# Patient Record
Sex: Male | Born: 1960 | Race: White | Hispanic: No | Marital: Married | Smoking: Current every day smoker
Health system: Southern US, Community
[De-identification: ages and names within clinical notes are randomized; demographics above are authoritative.]

## PROBLEM LIST (undated history)

## (undated) DIAGNOSIS — I499 Cardiac arrhythmia, unspecified: Secondary | ICD-10-CM

## (undated) DIAGNOSIS — M1990 Unspecified osteoarthritis, unspecified site: Secondary | ICD-10-CM

## (undated) DIAGNOSIS — M542 Cervicalgia: Secondary | ICD-10-CM

## (undated) DIAGNOSIS — I219 Acute myocardial infarction, unspecified: Secondary | ICD-10-CM

## (undated) DIAGNOSIS — E119 Type 2 diabetes mellitus without complications: Secondary | ICD-10-CM

## (undated) DIAGNOSIS — E785 Hyperlipidemia, unspecified: Secondary | ICD-10-CM

## (undated) DIAGNOSIS — J449 Chronic obstructive pulmonary disease, unspecified: Secondary | ICD-10-CM

## (undated) DIAGNOSIS — K219 Gastro-esophageal reflux disease without esophagitis: Secondary | ICD-10-CM

## (undated) DIAGNOSIS — K5792 Diverticulitis of intestine, part unspecified, without perforation or abscess without bleeding: Secondary | ICD-10-CM

## (undated) DIAGNOSIS — R0602 Shortness of breath: Secondary | ICD-10-CM

## (undated) DIAGNOSIS — J189 Pneumonia, unspecified organism: Secondary | ICD-10-CM

## (undated) DIAGNOSIS — I10 Essential (primary) hypertension: Secondary | ICD-10-CM

## (undated) DIAGNOSIS — I639 Cerebral infarction, unspecified: Secondary | ICD-10-CM

## (undated) DIAGNOSIS — G709 Myoneural disorder, unspecified: Secondary | ICD-10-CM

## (undated) DIAGNOSIS — G4730 Sleep apnea, unspecified: Secondary | ICD-10-CM

## (undated) HISTORY — DX: Hyperlipidemia, unspecified: E78.5

## (undated) HISTORY — DX: Acute myocardial infarction, unspecified: I21.9

## (undated) HISTORY — DX: Type 2 diabetes mellitus without complications: E11.9

## (undated) HISTORY — PX: OTHER SURGICAL HISTORY: SHX169

## (undated) HISTORY — DX: Myoneural disorder, unspecified: G70.9

## (undated) HISTORY — DX: Essential (primary) hypertension: I10

## (undated) HISTORY — DX: Cervicalgia: M54.2

---

## 2007-08-27 HISTORY — PX: FRACTURE SURGERY: SHX138

## 2007-08-27 HISTORY — PX: EYE SURGERY: SHX253

## 2010-08-26 DIAGNOSIS — J449 Chronic obstructive pulmonary disease, unspecified: Secondary | ICD-10-CM

## 2010-08-26 HISTORY — DX: Chronic obstructive pulmonary disease, unspecified: J44.9

## 2011-08-27 DIAGNOSIS — E785 Hyperlipidemia, unspecified: Secondary | ICD-10-CM

## 2011-08-27 HISTORY — DX: Hyperlipidemia, unspecified: E78.5

## 2011-08-27 HISTORY — PX: CARDIAC CATHETERIZATION: SHX172

## 2012-08-26 DIAGNOSIS — E119 Type 2 diabetes mellitus without complications: Secondary | ICD-10-CM

## 2012-08-26 DIAGNOSIS — I10 Essential (primary) hypertension: Secondary | ICD-10-CM

## 2012-08-26 HISTORY — DX: Type 2 diabetes mellitus without complications: E11.9

## 2012-08-26 HISTORY — DX: Essential (primary) hypertension: I10

## 2012-08-26 HISTORY — PX: COLONOSCOPY: SHX174

## 2013-01-30 DIAGNOSIS — R079 Chest pain, unspecified: Secondary | ICD-10-CM

## 2013-08-26 HISTORY — PX: SPINE SURGERY: SHX786

## 2013-11-10 ENCOUNTER — Other Ambulatory Visit: Payer: Self-pay | Admitting: Orthopedic Surgery

## 2013-11-10 DIAGNOSIS — M545 Low back pain, unspecified: Secondary | ICD-10-CM

## 2013-11-10 DIAGNOSIS — M542 Cervicalgia: Secondary | ICD-10-CM

## 2013-11-18 ENCOUNTER — Ambulatory Visit
Admission: RE | Admit: 2013-11-18 | Discharge: 2013-11-18 | Disposition: A | Discharge status: Home / Self Care | Payer: BC Managed Care – PPO | Source: Ambulatory Visit | Attending: Orthopedic Surgery | Admitting: Orthopedic Surgery

## 2013-11-18 DIAGNOSIS — M545 Low back pain, unspecified: Secondary | ICD-10-CM

## 2013-11-18 DIAGNOSIS — M542 Cervicalgia: Secondary | ICD-10-CM

## 2013-12-08 ENCOUNTER — Encounter (HOSPITAL_COMMUNITY): Payer: Self-pay | Admitting: Pharmacy Technician

## 2013-12-08 ENCOUNTER — Other Ambulatory Visit: Payer: Self-pay | Admitting: Orthopedic Surgery

## 2013-12-14 NOTE — Progress Notes (Signed)
Anesthesia Chart Review:  Patient is a 53 year old male scheduled for C6-7 ACDF on 12/16/13.  Her PAT appointment is scheduled for 12/15/13 at 2 PM.  I did receive a note of cardiac clearance from Dr. Adele Barthel. Matthew Stewart with [facility].  According to 12/01/13 office note, he had a PCI in 2012 in [place] and an echocardiogram in 05/2013 showed: EF 58-09%, grade 1 diastolic dysfunction.  I already requested records (ie, cath, stress, echo, and EKG) from [facility], but only an EKG from 12/01/13 was received that showed NSR.  Additional history and pre-operative testing pending his PAT visit tomorrow.  If additional outside records needed (ie 2012 cath) then it will have to be requested once he signs a medical release.  Matthew Stewart [facility] Short Stay Center/Anesthesiology Phone [phone] 12/14/2013 7:10 PM

## 2013-12-15 ENCOUNTER — Other Ambulatory Visit (HOSPITAL_COMMUNITY): Payer: Self-pay | Admitting: *Deleted

## 2013-12-15 ENCOUNTER — Encounter (HOSPITAL_COMMUNITY): Payer: Self-pay

## 2013-12-15 ENCOUNTER — Encounter (HOSPITAL_COMMUNITY)
Admission: RE | Admit: 2013-12-15 | Discharge: 2013-12-15 | Disposition: A | Discharge status: Home / Self Care | Payer: BC Managed Care – PPO | Source: Ambulatory Visit | Attending: Orthopedic Surgery | Admitting: Orthopedic Surgery

## 2013-12-15 HISTORY — DX: Gastro-esophageal reflux disease without esophagitis: K21.9

## 2013-12-15 HISTORY — DX: Acute myocardial infarction, unspecified: I21.9

## 2013-12-15 HISTORY — DX: Diverticulitis of intestine, part unspecified, without perforation or abscess without bleeding: K57.92

## 2013-12-15 HISTORY — DX: Pneumonia, unspecified organism: J18.9

## 2013-12-15 HISTORY — DX: Chronic obstructive pulmonary disease, unspecified: J44.9

## 2013-12-15 HISTORY — DX: Shortness of breath: R06.02

## 2013-12-15 HISTORY — DX: Unspecified osteoarthritis, unspecified site: M19.90

## 2013-12-15 HISTORY — DX: Cardiac arrhythmia, unspecified: I49.9

## 2013-12-15 LAB — CBC WITH DIFFERENTIAL/PLATELET
Basophils Absolute: 0.1 10*3/uL (ref 0.0–0.1)
Basophils Relative: 1 % (ref 0–1)
Eosinophils Absolute: 0.7 10*3/uL (ref 0.0–0.7)
Eosinophils Relative: 8 % — ABNORMAL HIGH (ref 0–5)
HCT: 51 % (ref 39.0–52.0)
HEMOGLOBIN: 17.4 g/dL — AB (ref 13.0–17.0)
LYMPHS PCT: 40 % (ref 12–46)
Lymphs Abs: 3.2 10*3/uL (ref 0.7–4.0)
MCH: 31.8 pg (ref 26.0–34.0)
MCHC: 34.1 g/dL (ref 30.0–36.0)
MCV: 93.2 fL (ref 78.0–100.0)
Monocytes Absolute: 0.6 10*3/uL (ref 0.1–1.0)
Monocytes Relative: 8 % (ref 3–12)
NEUTROS PCT: 43 % (ref 43–77)
Neutro Abs: 3.4 10*3/uL (ref 1.7–7.7)
PLATELETS: 193 10*3/uL (ref 150–400)
RBC: 5.47 MIL/uL (ref 4.22–5.81)
RDW: 12.9 % (ref 11.5–15.5)
WBC: 7.9 10*3/uL (ref 4.0–10.5)

## 2013-12-15 LAB — COMPREHENSIVE METABOLIC PANEL
ALK PHOS: 94 U/L (ref 39–117)
ALT: 54 U/L — AB (ref 0–53)
AST: 37 U/L (ref 0–37)
Albumin: 4 g/dL (ref 3.5–5.2)
BILIRUBIN TOTAL: 0.3 mg/dL (ref 0.3–1.2)
BUN: 19 mg/dL (ref 6–23)
CO2: 22 mEq/L (ref 19–32)
Calcium: 9.8 mg/dL (ref 8.4–10.5)
Chloride: 102 mEq/L (ref 96–112)
Creatinine, Ser: 0.84 mg/dL (ref 0.50–1.35)
GFR calc non Af Amer: >90 mL/min (ref >90)
Glucose, Bld: 196 mg/dL — ABNORMAL HIGH (ref 70–99)
POTASSIUM: 4.8 meq/L (ref 3.7–5.3)
SODIUM: 139 meq/L (ref 137–147)
Total Protein: 7.6 g/dL (ref 6.0–8.3)

## 2013-12-15 LAB — URINE MICROSCOPIC-ADD ON

## 2013-12-15 LAB — SURGICAL PCR SCREEN
MRSA, PCR: NEGATIVE
Staphylococcus aureus: NEGATIVE

## 2013-12-15 LAB — URINALYSIS, ROUTINE W REFLEX MICROSCOPIC
Bilirubin Urine: NEGATIVE
Glucose, UA: >1000 mg/dL — AB
KETONES UR: NEGATIVE mg/dL
Leukocytes, UA: NEGATIVE
Nitrite: NEGATIVE
PH: 5 (ref 5.0–8.0)
PROTEIN: NEGATIVE mg/dL
Specific Gravity, Urine: 1.041 — ABNORMAL HIGH (ref 1.005–1.030)
Urobilinogen, UA: 0.2 mg/dL (ref 0.0–1.0)

## 2013-12-15 LAB — PROTIME-INR
INR: 0.95 (ref 0.00–1.49)
Prothrombin Time: 12.5 seconds (ref 11.6–15.2)

## 2013-12-15 LAB — TYPE AND SCREEN
ABO/RH(D): B POS
Antibody Screen: NEGATIVE

## 2013-12-15 LAB — APTT: aPTT: 27 seconds (ref 24–37)

## 2013-12-15 LAB — ABO/RH: ABO/RH(D): B POS

## 2013-12-15 MED ORDER — POVIDONE-IODINE 7.5 % EX SOLN
Freq: Once | CUTANEOUS | Status: DC
  Filled 2013-12-15: qty 118

## 2013-12-15 MED ORDER — CEFAZOLIN SODIUM-DEXTROSE 2-3 GM-% IV SOLR
2.0000 g | INTRAVENOUS | Status: AC
  Administered 2013-12-16: 2 g via INTRAVENOUS
  Filled 2013-12-15: qty 50

## 2013-12-15 NOTE — Progress Notes (Signed)
12/15/13 1442  OBSTRUCTIVE SLEEP APNEA  Have you ever been diagnosed with sleep apnea through a sleep study? No  Do you snore loudly (loud enough to be heard through closed doors)?  0  Do you often feel tired, fatigued, or sleepy during the daytime? 1  Has anyone observed you stop breathing during your sleep? 0  Do you have, or are you being treated for high blood pressure? 0  BMI more than 35 kg/m2? 1  Age over 52 years old? 1  Neck circumference greater than 40 cm/16 inches? 1  Gender: 1  Obstructive Sleep Apnea Score 5  Score 4 or greater  Results sent to PCP

## 2013-12-15 NOTE — H&P (Addendum)
PREOPERATIVE H&P  Chief Complaint: left arm pain   HPI: Matthew Stewart is a 53 y.o. male who presents with ongoing pain in the left arm. 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 Patient failed conservative care and continues to have pain.  Past Medical History  Diagnosis Date  . COPD (chronic obstructive pulmonary disease)   . Shortness of breath     with exertion  . Myocardial infarction   . Dysrhythmia     irregular heart rate  . Pneumonia     "years ago"  . GERD (gastroesophageal reflux disease)     takes Zantac  . Arthritis     neck and lower back  . Diverticulitis     treated 18 years ago   Past Surgical History  Procedure Laterality Date  . Colonoscopy  2014    polyps removed and benign  . Cardiac catheterization  2013   History   Social History  . Marital Status: Married    Spouse Name: N/A    Number of Children: N/A  . Years of Education: N/A   Social History Main Topics  . Smoking status: Current Every Day Smoker  . Smokeless tobacco: Never Used     Comment: Plans to quit 12/15/13  . Alcohol Use: No  . Drug Use: No  . Sexual Activity: Not on file   Other Topics Concern  . Not on file   Social History Narrative  . No narrative on file   Family History  Problem Relation Age of Onset  . Hypertension Mother   . Diabetes type II Mother   . Diabetes type II Father   . Diabetes type II Sister    Allergies  Allergen Reactions  . Methocarbamol Nausea Only  . Spiriva Handihaler [Tiotropium Bromide Monohydrate] Rash   Prior to Admission medications   Medication Sig Start Date End Date Taking? Authorizing Provider  albuterol (PROVENTIL HFA;VENTOLIN HFA) 108 (90 BASE) MCG/ACT inhaler Inhale 2 puffs into the lungs every 6 (six) hours as needed for wheezing or shortness of breath.   Yes Historical Provider, MD  aspirin 81 MG chewable tablet Chew 81 mg by mouth daily.    Yes Historical Provider, MD  cyclobenzaprine (FLEXERIL) 10 MG tablet Take 10 mg by mouth 3  (three) times daily as needed for muscle spasms.   Yes Historical Provider, MD  ranitidine (ZANTAC) 300 MG tablet Take 300 mg by mouth at bedtime.    Yes Historical Provider, MD  traMADol (ULTRAM) 50 MG tablet Take 50 mg by mouth every 6 (six) hours as needed for moderate pain.   Yes Historical Provider, MD     All other systems have been reviewed and were otherwise negative with the exception of those mentioned in the HPI and as above.  Physical Exam: There were no vitals filed for this visit.  General: Alert, no acute distress Cardiovascular: No pedal edema Respiratory: No cyanosis, no use of accessory musculature GI: No organomegaly, abdomen is soft and non-tender Skin: No lesions in the area of chief complaint Neurologic: Sensation intact distally Psychiatric: Patient is competent for consent with normal mood and affect Lymphatic: No axillary or cervical lymphadenopathy  MUSCULOSKELETAL: + spurling on left  Assessment/Plan: Neck and left arm pain Plan for Procedure(s): ANTERIOR CERVICAL DECOMPRESSION/DISCECTOMY FUSION 1 LEVEL   Sinclair Ship, MD 12/15/2013 4:35 PM   Addendum: Patient's operative condition and surgical is cervical myelopathy, manifesting as balance deterioration and bilateral hand tingling and numbness.

## 2013-12-15 NOTE — Pre-Procedure Instructions (Signed)
Matthew Stewart  12/15/2013   Your procedure is scheduled on:  Thursday, December 16, 2013 at 7:30 AM.   Report to [facility] Entrance "A" Admitting Office at 5:30 AM.   Call this number if you have problems the morning of surgery: [phone]   Remember:   Do not eat food or drink liquids after midnight tonight.   Take these medicines the morning of surgery with A SIP OF WATER: traMADol (ULTRAM) - if needed, cyclobenzaprine (FLEXERIL) - if needed, you may use your Albuterol inhaler if needed and please bring it with you in the morning.     Do not wear jewelry.  Do not wear lotions, powders, or cologne. You may wear deodorant.  Men may shave face and neck.  Do not bring valuables to the hospital.  [facility] is not responsible                  for any belongings or valuables.               Contacts, dentures or bridgework may not be worn into surgery.  Leave suitcase in the car. After surgery it may be brought to your room.  For patients admitted to the hospital, discharge time is determined by your                treatment team.    Special Instructions: [facility] - Preparing for Surgery  Before surgery, you can play an important role.  Because skin is not sterile, your skin needs to be as free of germs as possible.  You can reduce the number of germs on you skin by washing with CHG (chlorahexidine gluconate) soap before surgery.  CHG is an antiseptic cleaner which kills germs and bonds with the skin to continue killing germs even after washing.  Please DO NOT use if you have an allergy to CHG or antibacterial soaps.  If your skin becomes reddened/irritated stop using the CHG and inform your nurse when you arrive at Short Stay.  Do not shave (including legs and underarms) for at least 48 hours prior to the first CHG shower.  You may shave your face.  Please follow these instructions carefully:   1.  Shower with CHG Soap the night before surgery and the                                 morning of Surgery.  2.  If you choose to wash your hair, wash your hair first as usual with your       normal shampoo.  3.  After you shampoo, rinse your hair and body thoroughly to remove the                      Shampoo.  4.  Use CHG as you would any other liquid soap.  You can apply chg directly       to the skin and wash gently with scrungie or a clean washcloth.  5.  Apply the CHG Soap to your body ONLY FROM THE NECK DOWN.        Do not use on open wounds or open sores.  Avoid contact with your eyes, ears, mouth and genitals (private parts).  Wash genitals (private parts) with your normal soap.  6.  Wash thoroughly, paying special attention to the area where your surgery  will be performed.  7.  Thoroughly rinse your body with warm water from the neck down.  8.  DO NOT shower/wash with your normal soap after using and rinsing off       the CHG Soap.  9.  Pat yourself dry with a clean towel.            10.  Wear clean pajamas.            11.  Place clean sheets on your bed the night of your first shower and do not        sleep with pets.  Day of Surgery  Do not apply any lotions the morning of surgery.  Please wear clean clothes to the hospital/surgery center.     Please read over the following fact sheets that you were given: Pain Booklet, Coughing and Deep Breathing, Blood Transfusion Information, MRSA Information and Surgical Site Infection Prevention

## 2013-12-16 ENCOUNTER — Encounter (HOSPITAL_COMMUNITY): Payer: BC Managed Care – PPO | Admitting: Vascular Surgery

## 2013-12-16 ENCOUNTER — Observation Stay (HOSPITAL_COMMUNITY)
Admission: RE | Admit: 2013-12-16 | Discharge: 2013-12-17 | Disposition: A | Discharge status: Home / Self Care | Payer: BC Managed Care – PPO | Source: Ambulatory Visit | Attending: Orthopedic Surgery | Admitting: Orthopedic Surgery

## 2013-12-16 ENCOUNTER — Encounter (HOSPITAL_COMMUNITY): Payer: Self-pay | Admitting: *Deleted

## 2013-12-16 ENCOUNTER — Ambulatory Visit (HOSPITAL_COMMUNITY): Payer: BC Managed Care – PPO | Admitting: Certified Registered Nurse Anesthetist

## 2013-12-16 ENCOUNTER — Ambulatory Visit (HOSPITAL_COMMUNITY): Payer: BC Managed Care – PPO

## 2013-12-16 ENCOUNTER — Encounter (HOSPITAL_COMMUNITY)
Admission: RE | Disposition: A | Discharge status: Home / Self Care | Payer: Self-pay | Source: Ambulatory Visit | Attending: Orthopedic Surgery

## 2013-12-16 DIAGNOSIS — Z01812 Encounter for preprocedural laboratory examination: Secondary | ICD-10-CM | POA: Insufficient documentation

## 2013-12-16 DIAGNOSIS — M5410 Radiculopathy, site unspecified: Secondary | ICD-10-CM | POA: Diagnosis present

## 2013-12-16 DIAGNOSIS — I499 Cardiac arrhythmia, unspecified: Secondary | ICD-10-CM | POA: Insufficient documentation

## 2013-12-16 DIAGNOSIS — Z7982 Long term (current) use of aspirin: Secondary | ICD-10-CM | POA: Insufficient documentation

## 2013-12-16 DIAGNOSIS — I252 Old myocardial infarction: Secondary | ICD-10-CM | POA: Insufficient documentation

## 2013-12-16 DIAGNOSIS — F17200 Nicotine dependence, unspecified, uncomplicated: Secondary | ICD-10-CM | POA: Insufficient documentation

## 2013-12-16 DIAGNOSIS — M5000 Cervical disc disorder with myelopathy, unspecified cervical region: Principal | ICD-10-CM | POA: Insufficient documentation

## 2013-12-16 DIAGNOSIS — Z01818 Encounter for other preprocedural examination: Secondary | ICD-10-CM | POA: Insufficient documentation

## 2013-12-16 DIAGNOSIS — Z79899 Other long term (current) drug therapy: Secondary | ICD-10-CM | POA: Insufficient documentation

## 2013-12-16 DIAGNOSIS — Z6838 Body mass index (BMI) 38.0-38.9, adult: Secondary | ICD-10-CM | POA: Insufficient documentation

## 2013-12-16 DIAGNOSIS — J4489 Other specified chronic obstructive pulmonary disease: Secondary | ICD-10-CM | POA: Insufficient documentation

## 2013-12-16 DIAGNOSIS — J449 Chronic obstructive pulmonary disease, unspecified: Secondary | ICD-10-CM | POA: Insufficient documentation

## 2013-12-16 DIAGNOSIS — K219 Gastro-esophageal reflux disease without esophagitis: Secondary | ICD-10-CM | POA: Insufficient documentation

## 2013-12-16 HISTORY — PX: ANTERIOR CERVICAL DECOMP/DISCECTOMY FUSION: SHX1161

## 2013-12-16 LAB — GLUCOSE, CAPILLARY
GLUCOSE-CAPILLARY: 211 mg/dL — AB (ref 70–99)
GLUCOSE-CAPILLARY: 224 mg/dL — AB (ref 70–99)
Glucose-Capillary: 218 mg/dL — ABNORMAL HIGH (ref 70–99)
Glucose-Capillary: 255 mg/dL — ABNORMAL HIGH (ref 70–99)

## 2013-12-16 SURGERY — ANTERIOR CERVICAL DECOMPRESSION/DISCECTOMY FUSION 1 LEVEL
Anesthesia: General | Site: Neck

## 2013-12-16 MED ORDER — ACETAMINOPHEN 325 MG PO TABS
650.0000 mg | ORAL_TABLET | ORAL | Status: DC | PRN
Start: 2013-12-16 — End: 2013-12-17

## 2013-12-16 MED ORDER — MIDAZOLAM HCL 5 MG/5ML IJ SOLN
INTRAMUSCULAR | Status: DC | PRN
  Administered 2013-12-16: 2 mg via INTRAVENOUS

## 2013-12-16 MED ORDER — ARTIFICIAL TEARS OP OINT
TOPICAL_OINTMENT | OPHTHALMIC | Status: AC
  Filled 2013-12-16: qty 3.5

## 2013-12-16 MED ORDER — MENTHOL 3 MG MT LOZG: 1.0000 | LOZENGE | OROMUCOSAL | Status: DC | PRN

## 2013-12-16 MED ORDER — ALBUTEROL SULFATE HFA 108 (90 BASE) MCG/ACT IN AERS
INHALATION_SPRAY | RESPIRATORY_TRACT | Status: AC
  Filled 2013-12-16: qty 6.7

## 2013-12-16 MED ORDER — FENTANYL CITRATE 0.05 MG/ML IJ SOLN
INTRAMUSCULAR | Status: AC
  Filled 2013-12-16: qty 5

## 2013-12-16 MED ORDER — BUPIVACAINE-EPINEPHRINE 0.25% -1:200000 IJ SOLN
INTRAMUSCULAR | Status: DC | PRN
  Administered 2013-12-16: 10 mL

## 2013-12-16 MED ORDER — THROMBIN 20000 UNITS EX SOLR
CUTANEOUS | Status: AC
  Filled 2013-12-16: qty 20000

## 2013-12-16 MED ORDER — LIDOCAINE HCL (CARDIAC) 20 MG/ML IV SOLN
INTRAVENOUS | Status: AC
  Filled 2013-12-16: qty 10

## 2013-12-16 MED ORDER — PROPOFOL 10 MG/ML IV BOLUS
INTRAVENOUS | Status: AC
  Filled 2013-12-16: qty 20

## 2013-12-16 MED ORDER — HYDROMORPHONE HCL PF 1 MG/ML IJ SOLN
0.2500 mg | INTRAMUSCULAR | Status: DC | PRN
  Administered 2013-12-16 (×3): 0.5 mg via INTRAVENOUS

## 2013-12-16 MED ORDER — OXYCODONE HCL 5 MG/5ML PO SOLN: 5.0000 mg | Freq: Once | ORAL | Status: DC | PRN

## 2013-12-16 MED ORDER — FAMOTIDINE 20 MG PO TABS
20.0000 mg | ORAL_TABLET | Freq: Every day | ORAL | Status: DC
  Administered 2013-12-16: 20 mg via ORAL
  Filled 2013-12-16 (×2): qty 1

## 2013-12-16 MED ORDER — NEOSTIGMINE METHYLSULFATE 1 MG/ML IJ SOLN
INTRAMUSCULAR | Status: DC | PRN
  Administered 2013-12-16: 4 mg via INTRAVENOUS

## 2013-12-16 MED ORDER — MIDAZOLAM HCL 2 MG/2ML IJ SOLN
INTRAMUSCULAR | Status: AC
  Filled 2013-12-16: qty 2

## 2013-12-16 MED ORDER — ONDANSETRON HCL 4 MG/2ML IJ SOLN
INTRAMUSCULAR | Status: AC
  Filled 2013-12-16: qty 2

## 2013-12-16 MED ORDER — 0.9 % SODIUM CHLORIDE (POUR BTL) OPTIME
TOPICAL | Status: DC | PRN
  Administered 2013-12-16: 1000 mL

## 2013-12-16 MED ORDER — EPHEDRINE SULFATE 50 MG/ML IJ SOLN
INTRAMUSCULAR | Status: AC
  Filled 2013-12-16: qty 1

## 2013-12-16 MED ORDER — ONDANSETRON HCL 4 MG/2ML IJ SOLN: 4.0000 mg | Freq: Once | INTRAMUSCULAR | Status: DC | PRN

## 2013-12-16 MED ORDER — ACETAMINOPHEN 650 MG RE SUPP: 650.0000 mg | RECTAL | Status: DC | PRN

## 2013-12-16 MED ORDER — OXYCODONE HCL 5 MG PO TABS: 5.0000 mg | ORAL_TABLET | Freq: Once | ORAL | Status: DC | PRN

## 2013-12-16 MED ORDER — ROCURONIUM BROMIDE 50 MG/5ML IV SOLN
INTRAVENOUS | Status: AC
  Filled 2013-12-16: qty 1

## 2013-12-16 MED ORDER — DOCUSATE SODIUM 100 MG PO CAPS
100.0000 mg | ORAL_CAPSULE | Freq: Two times a day (BID) | ORAL | Status: DC
  Administered 2013-12-16 (×2): 100 mg via ORAL
  Filled 2013-12-16 (×4): qty 1

## 2013-12-16 MED ORDER — STERILE WATER FOR INJECTION IJ SOLN
INTRAMUSCULAR | Status: AC
  Filled 2013-12-16: qty 10

## 2013-12-16 MED ORDER — DIAZEPAM 5 MG PO TABS
5.0000 mg | ORAL_TABLET | Freq: Four times a day (QID) | ORAL | Status: DC | PRN
  Administered 2013-12-16 – 2013-12-17 (×3): 5 mg via ORAL
  Filled 2013-12-16 (×3): qty 1

## 2013-12-16 MED ORDER — SENNOSIDES-DOCUSATE SODIUM 8.6-50 MG PO TABS
1.0000 | ORAL_TABLET | Freq: Every evening | ORAL | Status: DC | PRN
  Filled 2013-12-16: qty 1

## 2013-12-16 MED ORDER — GELATIN ABSORBABLE MT POWD
OROMUCOSAL | Status: DC | PRN
  Administered 2013-12-16: 08:00:00 via TOPICAL

## 2013-12-16 MED ORDER — LACTATED RINGERS IV SOLN
INTRAVENOUS | Status: DC | PRN
  Administered 2013-12-16 (×2): via INTRAVENOUS

## 2013-12-16 MED ORDER — BISACODYL 5 MG PO TBEC
5.0000 mg | DELAYED_RELEASE_TABLET | Freq: Every day | ORAL | Status: DC | PRN
  Filled 2013-12-16: qty 1

## 2013-12-16 MED ORDER — HYDROMORPHONE HCL PF 1 MG/ML IJ SOLN
INTRAMUSCULAR | Status: AC
  Filled 2013-12-16: qty 2

## 2013-12-16 MED ORDER — MAGNESIUM CITRATE PO SOLN: 1.0000 | Freq: Once | ORAL | Status: AC | PRN

## 2013-12-16 MED ORDER — CEFAZOLIN SODIUM 1-5 GM-% IV SOLN
1.0000 g | Freq: Three times a day (TID) | INTRAVENOUS | Status: AC
  Administered 2013-12-16 (×2): 1 g via INTRAVENOUS
  Filled 2013-12-16 (×3): qty 50

## 2013-12-16 MED ORDER — GLYCOPYRROLATE 0.2 MG/ML IJ SOLN
INTRAMUSCULAR | Status: AC
  Filled 2013-12-16: qty 4

## 2013-12-16 MED ORDER — SODIUM CHLORIDE 0.9 % IJ SOLN: 3.0000 mL | INTRAMUSCULAR | Status: DC | PRN

## 2013-12-16 MED ORDER — ALBUTEROL SULFATE (2.5 MG/3ML) 0.083% IN NEBU: 3.0000 mL | INHALATION_SOLUTION | Freq: Four times a day (QID) | RESPIRATORY_TRACT | Status: DC | PRN

## 2013-12-16 MED ORDER — HYDROMORPHONE HCL PF 1 MG/ML IJ SOLN: 0.5000 mg | INTRAMUSCULAR | Status: DC | PRN

## 2013-12-16 MED ORDER — PHENOL 1.4 % MT LIQD: 1.0000 | OROMUCOSAL | Status: DC | PRN

## 2013-12-16 MED ORDER — PROPOFOL 10 MG/ML IV BOLUS
INTRAVENOUS | Status: DC | PRN
  Administered 2013-12-16: 50 mg via INTRAVENOUS
  Administered 2013-12-16: 150 mg via INTRAVENOUS
  Administered 2013-12-16: 30 mg via INTRAVENOUS

## 2013-12-16 MED ORDER — NEOSTIGMINE METHYLSULFATE 1 MG/ML IJ SOLN
INTRAMUSCULAR | Status: AC
  Filled 2013-12-16: qty 10

## 2013-12-16 MED ORDER — OXYCODONE-ACETAMINOPHEN 5-325 MG PO TABS
1.0000 | ORAL_TABLET | ORAL | Status: DC | PRN
  Administered 2013-12-16 – 2013-12-17 (×4): 2 via ORAL
  Filled 2013-12-16 (×4): qty 2

## 2013-12-16 MED ORDER — BUPIVACAINE-EPINEPHRINE (PF) 0.25% -1:200000 IJ SOLN
INTRAMUSCULAR | Status: AC
  Filled 2013-12-16: qty 30

## 2013-12-16 MED ORDER — SODIUM CHLORIDE 0.9 % IV SOLN: 250.0000 mL | INTRAVENOUS | Status: DC

## 2013-12-16 MED ORDER — SODIUM CHLORIDE 0.9 % IJ SOLN
3.0000 mL | Freq: Two times a day (BID) | INTRAMUSCULAR | Status: DC
  Administered 2013-12-16: 3 mL via INTRAVENOUS

## 2013-12-16 MED ORDER — SUCCINYLCHOLINE CHLORIDE 20 MG/ML IJ SOLN
INTRAMUSCULAR | Status: AC
  Filled 2013-12-16: qty 1

## 2013-12-16 MED ORDER — ASPIRIN 81 MG PO CHEW
81.0000 mg | CHEWABLE_TABLET | Freq: Every day | ORAL | Status: DC
  Administered 2013-12-16: 81 mg via ORAL
  Filled 2013-12-16 (×2): qty 1

## 2013-12-16 MED ORDER — LIDOCAINE HCL (CARDIAC) 20 MG/ML IV SOLN
INTRAVENOUS | Status: DC | PRN
  Administered 2013-12-16: 100 mg via INTRAVENOUS

## 2013-12-16 MED ORDER — ARTIFICIAL TEARS OP OINT
TOPICAL_OINTMENT | OPHTHALMIC | Status: DC | PRN
  Administered 2013-12-16: 1 via OPHTHALMIC

## 2013-12-16 MED ORDER — ROCURONIUM BROMIDE 100 MG/10ML IV SOLN
INTRAVENOUS | Status: DC | PRN
  Administered 2013-12-16: 20 mg via INTRAVENOUS
  Administered 2013-12-16: 50 mg via INTRAVENOUS

## 2013-12-16 MED ORDER — LIDOCAINE HCL 4 % MT SOLN
OROMUCOSAL | Status: DC | PRN
  Administered 2013-12-16: 4 mL via TOPICAL

## 2013-12-16 MED ORDER — LIVING WELL WITH DIABETES BOOK
Freq: Once | Status: AC
  Administered 2013-12-16: 14:00:00
  Filled 2013-12-16 (×2): qty 1

## 2013-12-16 MED ORDER — ALBUTEROL SULFATE HFA 108 (90 BASE) MCG/ACT IN AERS
INHALATION_SPRAY | RESPIRATORY_TRACT | Status: DC | PRN
  Administered 2013-12-16: 2 via RESPIRATORY_TRACT

## 2013-12-16 MED ORDER — ONDANSETRON HCL 4 MG/2ML IJ SOLN: 4.0000 mg | INTRAMUSCULAR | Status: DC | PRN

## 2013-12-16 MED ORDER — ALUM & MAG HYDROXIDE-SIMETH 200-200-20 MG/5ML PO SUSP: 30.0000 mL | Freq: Four times a day (QID) | ORAL | Status: DC | PRN

## 2013-12-16 MED ORDER — FENTANYL CITRATE 0.05 MG/ML IJ SOLN
INTRAMUSCULAR | Status: DC | PRN
  Administered 2013-12-16: 150 ug via INTRAVENOUS
  Administered 2013-12-16: 100 ug via INTRAVENOUS

## 2013-12-16 MED ORDER — ONDANSETRON HCL 4 MG/2ML IJ SOLN
INTRAMUSCULAR | Status: DC | PRN
  Administered 2013-12-16: 4 mg via INTRAVENOUS

## 2013-12-16 MED ORDER — GLYCOPYRROLATE 0.2 MG/ML IJ SOLN
INTRAMUSCULAR | Status: DC | PRN
  Administered 2013-12-16: 0.6 mg via INTRAVENOUS

## 2013-12-16 MED ORDER — MEPERIDINE HCL 25 MG/ML IJ SOLN: 6.2500 mg | INTRAMUSCULAR | Status: DC | PRN

## 2013-12-16 SURGICAL SUPPLY — 72 items
BENZOIN TINCTURE PRP APPL 2/3 (GAUZE/BANDAGES/DRESSINGS) ×2 IMPLANT
BIT DRILL NEURO 2X3.1 SFT TUCH (MISCELLANEOUS) ×1 IMPLANT
BIT DRILL SRG 14X2.2XFLT CHK (BIT) ×1 IMPLANT
BIT DRL SRG 14X2.2XFLT CHK (BIT) ×1
BLADE SURG 15 STRL LF DISP TIS (BLADE) ×1 IMPLANT
BLADE SURG 15 STRL SS (BLADE) ×1
BLADE SURG ROTATE 9660 (MISCELLANEOUS) ×2 IMPLANT
BUR MATCHSTICK NEURO 3.0 LAGG (BURR) ×2 IMPLANT
CARTRIDGE OIL MAESTRO DRILL (MISCELLANEOUS) ×1 IMPLANT
CLSR STERI-STRIP ANTIMIC 1/2X4 (GAUZE/BANDAGES/DRESSINGS) ×2 IMPLANT
CORDS BIPOLAR (ELECTRODE) ×2 IMPLANT
COVER SURGICAL LIGHT HANDLE (MISCELLANEOUS) ×2 IMPLANT
CRADLE DONUT ADULT HEAD (MISCELLANEOUS) ×2 IMPLANT
DIFFUSER DRILL AIR PNEUMATIC (MISCELLANEOUS) ×2 IMPLANT
DRAIN JACKSON RD 7FR 3/32 (WOUND CARE) IMPLANT
DRAPE C-ARM 42X72 X-RAY (DRAPES) ×2 IMPLANT
DRAPE POUCH INSTRU U-SHP 10X18 (DRAPES) ×2 IMPLANT
DRAPE SURG 17X23 STRL (DRAPES) ×6 IMPLANT
DRILL BIT SKYLINE 14MM (BIT) ×1
DRILL NEURO 2X3.1 SOFT TOUCH (MISCELLANEOUS) ×2
DURAPREP 26ML APPLICATOR (WOUND CARE) ×2 IMPLANT
ELECT COATED BLADE 2.86 ST (ELECTRODE) ×2 IMPLANT
ELECT REM PT RETURN 9FT ADLT (ELECTROSURGICAL) ×2
ELECTRODE REM PT RTRN 9FT ADLT (ELECTROSURGICAL) ×1 IMPLANT
EVACUATOR SILICONE 100CC (DRAIN) IMPLANT
GAUZE SPONGE 4X4 16PLY XRAY LF (GAUZE/BANDAGES/DRESSINGS) ×2 IMPLANT
GLOVE BIO SURGEON STRL SZ7 (GLOVE) ×2 IMPLANT
GLOVE BIO SURGEON STRL SZ8 (GLOVE) ×2 IMPLANT
GLOVE BIOGEL PI IND STRL 7.0 (GLOVE) ×2 IMPLANT
GLOVE BIOGEL PI IND STRL 8 (GLOVE) ×1 IMPLANT
GLOVE BIOGEL PI INDICATOR 7.0 (GLOVE) ×2
GLOVE BIOGEL PI INDICATOR 8 (GLOVE) ×1
GOWN STRL REUS W/ TWL LRG LVL3 (GOWN DISPOSABLE) ×1 IMPLANT
GOWN STRL REUS W/ TWL XL LVL3 (GOWN DISPOSABLE) ×1 IMPLANT
GOWN STRL REUS W/TWL LRG LVL3 (GOWN DISPOSABLE) ×1
GOWN STRL REUS W/TWL XL LVL3 (GOWN DISPOSABLE) ×1
IMPL S ENDOSKEL TC 7 ODEG (Orthopedic Implant) ×1 IMPLANT
IMPLANT S ENDOSKEL TC 7 ODEG (Orthopedic Implant) ×2 IMPLANT
IV CATH 14GX2 1/4 (CATHETERS) ×2 IMPLANT
KIT BASIN OR (CUSTOM PROCEDURE TRAY) ×2 IMPLANT
KIT ROOM TURNOVER OR (KITS) ×2 IMPLANT
MANIFOLD NEPTUNE II (INSTRUMENTS) ×2 IMPLANT
NEEDLE 27GAX1X1/2 (NEEDLE) ×2 IMPLANT
NEEDLE SPNL 20GX3.5 QUINCKE YW (NEEDLE) ×2 IMPLANT
NS IRRIG 1000ML POUR BTL (IV SOLUTION) ×2 IMPLANT
OIL CARTRIDGE MAESTRO DRILL (MISCELLANEOUS) ×2
PACK ORTHO CERVICAL (CUSTOM PROCEDURE TRAY) ×2 IMPLANT
PAD ARMBOARD 7.5X6 YLW CONV (MISCELLANEOUS) ×4 IMPLANT
PATTIES SURGICAL .5 X.5 (GAUZE/BANDAGES/DRESSINGS) IMPLANT
PATTIES SURGICAL .5 X1 (DISPOSABLE) IMPLANT
PIN DISTRACTION 14 (PIN) ×4 IMPLANT
PLATE ONE LEVEL SKYLINE 14MM (Plate) ×2 IMPLANT
PUTTY BONE DBX 2.5 MIS (Bone Implant) ×2 IMPLANT
SCREW VAR SELF TAP SKYLINE 14M (Screw) ×8 IMPLANT
SPONGE GAUZE 4X4 12PLY (GAUZE/BANDAGES/DRESSINGS) ×2 IMPLANT
SPONGE GAUZE 4X4 12PLY STER LF (GAUZE/BANDAGES/DRESSINGS) ×2 IMPLANT
SPONGE INTESTINAL PEANUT (DISPOSABLE) ×2 IMPLANT
SPONGE SURGIFOAM ABS GEL 100 (HEMOSTASIS) ×2 IMPLANT
STRIP CLOSURE SKIN 1/2X4 (GAUZE/BANDAGES/DRESSINGS) ×2 IMPLANT
SURGIFLO TRUKIT (HEMOSTASIS) IMPLANT
SUT MNCRL AB 4-0 PS2 18 (SUTURE) ×2 IMPLANT
SUT SILK 4 0 (SUTURE)
SUT SILK 4-0 18XBRD TIE 12 (SUTURE) IMPLANT
SUT VIC AB 2-0 CT2 18 VCP726D (SUTURE) ×2 IMPLANT
SYR BULB IRRIGATION 50ML (SYRINGE) ×2 IMPLANT
SYR CONTROL 10ML LL (SYRINGE) ×4 IMPLANT
TAPE CLOTH 4X10 WHT NS (GAUZE/BANDAGES/DRESSINGS) ×2 IMPLANT
TAPE UMBILICAL COTTON 1/8X30 (MISCELLANEOUS) ×2 IMPLANT
TOWEL OR 17X24 6PK STRL BLUE (TOWEL DISPOSABLE) ×2 IMPLANT
TOWEL OR 17X26 10 PK STRL BLUE (TOWEL DISPOSABLE) ×2 IMPLANT
WATER STERILE IRR 1000ML POUR (IV SOLUTION) ×2 IMPLANT
YANKAUER SUCT BULB TIP NO VENT (SUCTIONS) ×2 IMPLANT

## 2013-12-16 NOTE — Progress Notes (Signed)
Attempted to refax to [facility] in [place] to request cardiac cath report, awaiting fax. Sharyn Lull, Agricultural consultant also called.  Pt's CBG remains elevated, encouraged to follow up with PCP as soon as possible after surgery. OR also informed.

## 2013-12-16 NOTE — Transfer of Care (Signed)
Immediate Anesthesia Transfer of Care Note  Patient: Matthew Stewart  Procedure(s) Performed: Procedure(s) with comments: ANTERIOR CERVICAL DECOMPRESSION/DISCECTOMY FUSION 1 LEVEL (N/A) - Anterior cervical decompression fusion cervical 6-7 with instrumentation and allograft  Patient Location: PACU  Anesthesia Type:General  Level of Consciousness: awake and alert   Airway & Oxygen Therapy: Patient Spontanous Breathing and Patient connected to nasal cannula oxygen  Post-op Assessment: Report given to PACU RN, Post -op Vital signs reviewed and stable and Patient moving all extremities X 4  Post vital signs: Reviewed and stable  Complications: No apparent anesthesia complications

## 2013-12-16 NOTE — Anesthesia Procedure Notes (Signed)
Procedure Name: Intubation Date/Time: 12/16/2013 7:42 AM Performed by: Jacob Moores Pre-anesthesia Checklist: Patient identified, Emergency Drugs available, Suction available and Patient being monitored Patient Re-evaluated:Patient Re-evaluated prior to inductionOxygen Delivery Method: Circle system utilized Preoxygenation: Pre-oxygenation with 100% oxygen Intubation Type: IV induction Ventilation: Mask ventilation without difficulty and Oral airway inserted - appropriate to patient size Laryngoscope Size: Sabra Heck and 2 Grade View: Grade II Tube type: Oral Tube size: 7.5 mm Number of attempts: 1 Airway Equipment and Method: Stylet,  LTA kit utilized and Oral airway Placement Confirmation: ETT inserted through vocal cords under direct vision,  positive ETCO2 and breath sounds checked- equal and bilateral Secured at: 20 cm Tube secured with: Tape Dental Injury: Teeth and Oropharynx as per pre-operative assessment

## 2013-12-16 NOTE — Progress Notes (Signed)
Orthopedic Tech Progress Note Patient Details:  Matthew Stewart [birth year] [id number]  Ortho Devices Type of Ortho Device: [place] cervical collar Ortho Device/Splint Interventions: Adjustment   [name] 12/16/2013, 12:06 PM

## 2013-12-16 NOTE — Anesthesia Preprocedure Evaluation (Addendum)
Anesthesia Evaluation  Patient identified by MRN, date of birth, ID band Patient awake    Reviewed: Allergy & Precautions, H&P , NPO status , Patient's Chart, lab work & pertinent test results  Airway Mallampati: II TM Distance: >3 FB Neck ROM: Full    Dental  (+) Dental Advisory Given, Edentulous Upper, Edentulous Lower   Pulmonary shortness of breath and with exertion, COPD COPD inhaler, Current Smoker,          Cardiovascular + Past MI + dysrhythmias     Neuro/Psych    GI/Hepatic GERD-  Medicated,  Endo/Other  Morbid obesity  Renal/GU      Musculoskeletal  (+) Arthritis -,   Abdominal   Peds  Hematology   Anesthesia Other Findings   Reproductive/Obstetrics                          Anesthesia Physical Anesthesia Plan  ASA: III  Anesthesia Plan: General   Post-op Pain Management:    Induction: Intravenous  Airway Management Planned: Oral ETT  Additional Equipment:   Intra-op Plan:   Post-operative Plan: Extubation in OR  Informed Consent: I have reviewed the patients History and Physical, chart, labs and discussed the procedure including the risks, benefits and alternatives for the proposed anesthesia with the patient or authorized representative who has indicated his/her understanding and acceptance.   Dental advisory given  Plan Discussed with: CRNA, Anesthesiologist and Surgeon  Anesthesia Plan Comments:         Anesthesia Quick Evaluation

## 2013-12-16 NOTE — Progress Notes (Addendum)
Inpatient Diabetes Program Recommendations  AACE/ADA: New Consensus Statement on Inpatient Glycemic Control (2013)  Target Ranges:  Prepandial:   less than 140 mg/dL      Peak postprandial:   less than 180 mg/dL (1-2 hours)      Critically ill patients:  140 - 180 mg/dL  Results for JAXTIN, RAIMONDO (MRN [id number]) as of 12/16/2013 12:20  Ref. Range 12/15/2013 15:16  Glucose Latest Range: 70-99 mg/dL 196 (H)   Results for MARIE, CHOW (MRN [id number]) as of 12/16/2013 12:20  Ref. Range 12/16/2013 06:06 12/16/2013 10:25  Glucose-Capillary Latest Range: 70-99 mg/dL 218 (H) 211 (H)   Diabetes history: No DM hx Outpatient Diabetes medications: NA Current orders for Inpatient glycemic control: None  Inpatient Diabetes Program Recommendations Correction (SSI): Please consider ordering CBGs with Novolog correction scale ACHS. HgbA1C: Please order an A1C to evaluate glycemic control over the past 2-3 months. Diet: Please change diet from Regular to Carbohydrate Modified Diabetic diet.  Note: Patient has no prior history of diabetes.  However, initial CBG was 196 mg/dl and fasting CBG this morning is 218 mg/dl.  Please order A1C to evaluate glycemic control over the past 2-3 months and to determine if patient meets ADA criteria for diabetes diagnosis.  If patient is being diagnosed with diabetes, please inform patient and staff so that patient can be educated about diabetes. While inpatient, please order CBGs with Novolog correction scale ACHS and change diet to Carb Modified Diabetic diet.  12/16/13@13 :30-Talked with the patient about diabetes and patient reports that his sister, father, and grandmother have diabetes.  Patient also states that when he lived in [place] he was told by his PCP that he had prediabetes about 4 years ago.  Over the past year, he gained over 100 pounds and has been trying to lose weight.  Informed patient about elevated glucose and explained that if his A1C is not checked here in  the hospital he needs to ask his PCP to check it to determine if he has diabetes. Informed patient I would order Living Well with Diabetes booklet which he can review and educate himself on diabetes. Also talked with RN and asked that she make MD aware of request for CBGs, Novolog correction, A1C, and diet change.  Patient verbalized understanding of information discussed and states that he has no questions related to diabetes at this time.  Thanks, Barnie Alderman, RN, MSN, CCRN Diabetes Coordinator Inpatient Diabetes Program [phone] (Team Pager)  [phone] ([facility] office) [phone] [facility] office)

## 2013-12-17 LAB — GLUCOSE, CAPILLARY: Glucose-Capillary: 210 mg/dL — ABNORMAL HIGH (ref 70–99)

## 2013-12-17 NOTE — Op Note (Signed)
Matthew, Stewart NO.:  [id number]  MEDICAL RECORD NO.:  [id number]  LOCATION:  [id number]                        FACILITY:  [facility]  PHYSICIAN:  Phylliss Bob, MD      DATE OF BIRTH:  Nov 19, 1960  DATE OF PROCEDURE:  12/16/2013                              OPERATIVE REPORT   PREOPERATIVE DIAGNOSES: 1. Cervical myelopathy. 2. 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  POSTOPERATIVE DIAGNOSES: 1. Cervical myelopathy. 2. 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  PROCEDURE: 1. Anterior cervical decompression and fusion, C6-7. 2. Placement of anterior instrumentation, C6-7. 3. Insertion of interbody device x1 (7 mm small parallel interbody     spacer). 4. Use of morselized allograft (DBX mix). 5. Use of morselized allograft (DBX mix).  SURGEON:  Phylliss Bob, MD  ASSISTANT:  Pricilla Holm, PA-C  ANESTHESIA:  General endotracheal anesthesia.  COMPLICATIONS:  None.  DISPOSITION:  Stable.  ESTIMATED BLOOD LOSS:  Minimal.  INDICATIONS FOR PROCEDURE:  Briefly, Matthew Stewart is a 53 year old male, who did present to me with progressive deterioration of balance and deterioration in his fine motor skills.  An MRI of his cervical spine did clearly reveal substantial stenosis at the C6-7 level, which should readily correlate to the patient's symptoms.  Given the natural history of cervical myelopathy, we did discuss proceeding with the procedure reflected above.  The patient did fully understand the risks and limitations of the procedure.  Of particular note, the patient did understand the goal of surgery was to prevent additional deterioration of his myelopathy, and not necessarily to reverse his current constellation of symptoms.  OPERATIVE DETAILS:  On December 16, 2013, the patient was brought to Surgery and general endotracheal anesthesia was administered.  The patient was placed  supine on the hospital bed.  His arms were secured to his sides.  The region of the ulnar nerves was padded.  The neck was prepped and draped in the usual sterile fashion, and a left-sided transverse incision was made.  Smith-Robinson approach was utilized to approach the anterior cervical spine.  A lateral intraoperative fluoroscopic view did confirm the appropriate operative level.  I then subperiosteally exposed the vertebral bodies of C6 and C7.  A self- retaining retractor was placed.  Caspar pins were placed into the C6 and C7 vertebral bodies and distraction was applied.  I then went forward with a thorough and complete diskectomy to the posterior longitudinal ligament.  The neural foramina were decompressed bilaterally as well.  I was very pleased with the decompression that I was able to accomplish. The endplates were then prepared and I then packed a 7 mm parallel interbody spacer with DBX mix and tamped the interbody spacer into position in the usual fashion.  The Caspar pins were removed.  The bone wax was placed in their place.  A 14 mm anterior cervical plate was placed over the spine.  A 14-mm variable angle screws were placed, 2 in each vertebral body of C6 and C7 for a total of 4 screws.  The CAM locking mechanism was  utilized per the [employer] recommendations. The wound was then copiously irrigated.  I then explored the wound for any undue bleeding.  There were very minor areas of bleeding which were controlled using electrocautery.  The platysma was then closed using 2-0 Vicryl, and the skin was closed using 3-0 Monocryl.  Benzoin and Steri- Strips were applied followed by a sterile dressing.  All instrument counts were correct at the termination of the procedure.  Of note, Pricilla Holm was my assistant throughout the surgery and aided in retraction, suctioning and closure.     Phylliss Bob, MD     MD/MEDQ  D:  12/16/2013  T:  12/16/2013  Job:  [id number]

## 2013-12-17 NOTE — Progress Notes (Signed)
Pt. Alert and oriented, follows simple instructions, denies pain. Incision area without swelling, redness or S/S of infection. Voiding adequate clear yellow urine. Moving all extremities well and vitals stable and documented. Patient discharged home with family. Anterior Cervical Fusion surgery notes instructions given to patient and family member for home safety and precautions. Pt. and family stated understanding of instructions given.  

## 2013-12-17 NOTE — Progress Notes (Signed)
PO day 1 S/P C6-7 ACDF, resolved myelopathy symptoms, pt states he can feel hands and legs "for first time in years" has been up walking and is eager to go home. Denies SOB/Swallowing difficulties/N/V. Neck pain minimal and well controlled  BP 112/61  Pulse 90  Temp(Src) 97.7 F (36.5 C) (Oral)  Resp 18  Ht 6\' 5"  (1.956 m)  Wt 146.058 kg (322 lb)  BMI 38.18 kg/m2  SpO2 94%  Pt up and ambulating in hallways soft collar in place. Dressing CDI, NVI  PO day 1 S/P C6-7 ACDF  -Resolved myelopathy symptoms  -Minimal neck pain  -D/C home   -Written scripts in chart   -D/C orders in chart   -No lifting >10lbs   -Soft Collar at all times except showering  -F/U in office 2 weeks

## 2013-12-17 NOTE — Anesthesia Postprocedure Evaluation (Addendum)
Anesthesia Post Note  Patient: Matthew Stewart  Procedure(s) Performed: Procedure(s) (LRB): ANTERIOR CERVICAL DECOMPRESSION/DISCECTOMY FUSION 1 LEVEL (N/A)  Anesthesia type: general  Patient location: PACU  Post pain: Pain level controlled  Post assessment: Patient's Cardiovascular Status Stable  Last Vitals:  Filed Vitals:   12/17/13 0400  BP: 112/78  Pulse: 79  Temp: 36.9 C  Resp: 20    Post vital signs: Reviewed and stable  Level of consciousness: sedated  Complications: No apparent anesthesia complications

## 2013-12-21 ENCOUNTER — Encounter (HOSPITAL_COMMUNITY): Payer: Self-pay | Admitting: Orthopedic Surgery
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
## 2015-08-27 DIAGNOSIS — G709 Myoneural disorder, unspecified: Secondary | ICD-10-CM

## 2015-08-27 HISTORY — DX: Myoneural disorder, unspecified: G70.9

## 2016-04-01 DIAGNOSIS — J449 Chronic obstructive pulmonary disease, unspecified: Secondary | ICD-10-CM | POA: Insufficient documentation

## 2016-04-01 DIAGNOSIS — I2510 Atherosclerotic heart disease of native coronary artery without angina pectoris: Secondary | ICD-10-CM | POA: Insufficient documentation

## 2017-04-10 ENCOUNTER — Ambulatory Visit (INDEPENDENT_AMBULATORY_CARE_PROVIDER_SITE_OTHER): Payer: BLUE CROSS/BLUE SHIELD | Admitting: Family Medicine

## 2017-04-10 ENCOUNTER — Encounter: Payer: Self-pay | Admitting: Family Medicine

## 2017-04-10 VITALS — BP 109/71 | HR 60 | Temp 97.0°F | Ht 77.0 in | Wt 298.0 lb

## 2017-04-10 DIAGNOSIS — E1169 Type 2 diabetes mellitus with other specified complication: Secondary | ICD-10-CM | POA: Diagnosis not present

## 2017-04-10 DIAGNOSIS — E1159 Type 2 diabetes mellitus with other circulatory complications: Secondary | ICD-10-CM | POA: Diagnosis not present

## 2017-04-10 DIAGNOSIS — I10 Essential (primary) hypertension: Secondary | ICD-10-CM

## 2017-04-10 DIAGNOSIS — Z720 Tobacco use: Secondary | ICD-10-CM

## 2017-04-10 DIAGNOSIS — K219 Gastro-esophageal reflux disease without esophagitis: Secondary | ICD-10-CM

## 2017-04-10 DIAGNOSIS — E669 Obesity, unspecified: Secondary | ICD-10-CM | POA: Diagnosis not present

## 2017-04-10 DIAGNOSIS — J449 Chronic obstructive pulmonary disease, unspecified: Secondary | ICD-10-CM | POA: Insufficient documentation

## 2017-04-10 DIAGNOSIS — J439 Emphysema, unspecified: Secondary | ICD-10-CM

## 2017-04-10 DIAGNOSIS — E785 Hyperlipidemia, unspecified: Secondary | ICD-10-CM

## 2017-04-10 DIAGNOSIS — I152 Hypertension secondary to endocrine disorders: Secondary | ICD-10-CM | POA: Insufficient documentation

## 2017-04-10 LAB — BAYER DCA HB A1C WAIVED: HB A1C: 7.9 % — AB (ref <7.0)

## 2017-04-10 MED ORDER — LISINOPRIL 2.5 MG PO TABS: 2.5000 mg | ORAL_TABLET | Freq: Every day | ORAL | 2 refills | Status: DC

## 2017-04-10 MED ORDER — FLUTICASONE FUROATE-VILANTEROL 100-25 MCG/INH IN AEPB: 1.0000 | INHALATION_SPRAY | Freq: Every day | RESPIRATORY_TRACT | 2 refills | Status: DC

## 2017-04-10 MED ORDER — METOPROLOL SUCCINATE ER 25 MG PO TB24: 25.0000 mg | ORAL_TABLET | Freq: Every day | ORAL | 2 refills | Status: DC

## 2017-04-10 MED ORDER — PRAVASTATIN SODIUM 20 MG PO TABS: 20.0000 mg | ORAL_TABLET | Freq: Every day | ORAL | 2 refills | Status: DC

## 2017-04-10 MED ORDER — RANITIDINE HCL 300 MG PO TABS: 300.0000 mg | ORAL_TABLET | Freq: Every day | ORAL | 6 refills | Status: DC

## 2017-04-10 MED ORDER — METFORMIN HCL ER 500 MG PO TB24: 500.0000 mg | ORAL_TABLET | Freq: Every day | ORAL | 2 refills | Status: DC

## 2017-04-10 MED ORDER — INVOKANA 300 MG PO TABS: 300.0000 mg | ORAL_TABLET | Freq: Every day | ORAL | 2 refills | Status: DC
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
## 2017-04-11 ENCOUNTER — Other Ambulatory Visit: Payer: Self-pay

## 2017-04-11 DIAGNOSIS — E669 Obesity, unspecified: Secondary | ICD-10-CM

## 2017-04-11 DIAGNOSIS — E1169 Type 2 diabetes mellitus with other specified complication: Secondary | ICD-10-CM

## 2017-04-11 LAB — CMP14+EGFR
A/G RATIO: 1.6 (ref 1.2–2.2)
ALK PHOS: 78 IU/L (ref 39–117)
ALT: 28 IU/L (ref 0–44)
AST: 32 IU/L (ref 0–40)
Albumin: 4.4 g/dL (ref 3.5–5.5)
BUN / CREAT RATIO: 15 (ref 9–20)
BUN: 13 mg/dL (ref 6–24)
Bilirubin Total: 0.5 mg/dL (ref 0.0–1.2)
CO2: 21 mmol/L (ref 20–29)
Calcium: 9.6 mg/dL (ref 8.7–10.2)
Chloride: 103 mmol/L (ref 96–106)
Creatinine, Ser: 0.86 mg/dL (ref 0.76–1.27)
GFR calc Af Amer: 113 mL/min/{1.73_m2} (ref >59)
GFR calc non Af Amer: 98 mL/min/{1.73_m2} (ref >59)
GLOBULIN, TOTAL: 2.8 g/dL (ref 1.5–4.5)
Glucose: 133 mg/dL — ABNORMAL HIGH (ref 65–99)
Potassium: 4.9 mmol/L (ref 3.5–5.2)
SODIUM: 141 mmol/L (ref 134–144)
Total Protein: 7.2 g/dL (ref 6.0–8.5)

## 2017-04-11 LAB — LIPID PANEL
CHOLESTEROL TOTAL: 144 mg/dL (ref 100–199)
Chol/HDL Ratio: 3.6 ratio (ref 0.0–5.0)
HDL: 40 mg/dL (ref >39)
LDL Calculated: 74 mg/dL (ref 0–99)
Triglycerides: 149 mg/dL (ref 0–149)
VLDL Cholesterol Cal: 30 mg/dL (ref 5–40)

## 2017-04-11 LAB — MICROALBUMIN / CREATININE URINE RATIO
Creatinine, Urine: 76.9 mg/dL
MICROALB/CREAT RATIO: 6.1 mg/g{creat} (ref 0.0–30.0)
MICROALBUM., U, RANDOM: 4.7 ug/mL

## 2017-04-11 MED ORDER — METFORMIN HCL ER 500 MG PO TB24: 500.0000 mg | ORAL_TABLET | Freq: Two times a day (BID) | ORAL | 2 refills | Status: DC

## 2017-04-30 ENCOUNTER — Encounter: Payer: Self-pay | Admitting: Family Medicine

## 2017-04-30 ENCOUNTER — Ambulatory Visit (INDEPENDENT_AMBULATORY_CARE_PROVIDER_SITE_OTHER): Payer: BLUE CROSS/BLUE SHIELD | Admitting: Family Medicine

## 2017-04-30 VITALS — BP 106/77 | HR 72 | Temp 97.4°F | Ht 77.0 in | Wt 298.0 lb

## 2017-04-30 DIAGNOSIS — H9313 Tinnitus, bilateral: Secondary | ICD-10-CM | POA: Diagnosis not present
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
## 2017-05-20 DIAGNOSIS — F17200 Nicotine dependence, unspecified, uncomplicated: Secondary | ICD-10-CM | POA: Insufficient documentation

## 2017-05-20 DIAGNOSIS — J342 Deviated nasal septum: Secondary | ICD-10-CM | POA: Insufficient documentation

## 2017-05-20 DIAGNOSIS — H833X3 Noise effects on inner ear, bilateral: Secondary | ICD-10-CM | POA: Insufficient documentation

## 2017-06-08 ENCOUNTER — Other Ambulatory Visit: Payer: Self-pay | Admitting: Family Medicine

## 2017-06-08 DIAGNOSIS — E669 Obesity, unspecified: Secondary | ICD-10-CM

## 2017-06-08 DIAGNOSIS — E1169 Type 2 diabetes mellitus with other specified complication: Secondary | ICD-10-CM

## 2017-06-17 ENCOUNTER — Other Ambulatory Visit: Payer: Self-pay | Admitting: Family Medicine

## 2017-06-17 DIAGNOSIS — E669 Obesity, unspecified: Secondary | ICD-10-CM

## 2017-06-17 DIAGNOSIS — E1169 Type 2 diabetes mellitus with other specified complication: Secondary | ICD-10-CM

## 2017-06-25 ENCOUNTER — Other Ambulatory Visit: Payer: Self-pay

## 2017-06-25 DIAGNOSIS — E1169 Type 2 diabetes mellitus with other specified complication: Secondary | ICD-10-CM

## 2017-06-25 DIAGNOSIS — E669 Obesity, unspecified: Secondary | ICD-10-CM

## 2017-06-25 MED ORDER — METFORMIN HCL ER 500 MG PO TB24: 500.0000 mg | ORAL_TABLET | Freq: Two times a day (BID) | ORAL | 0 refills | Status: DC

## 2017-07-01 ENCOUNTER — Other Ambulatory Visit: Payer: Self-pay | Admitting: Family Medicine

## 2017-07-01 DIAGNOSIS — E1169 Type 2 diabetes mellitus with other specified complication: Secondary | ICD-10-CM

## 2017-07-01 DIAGNOSIS — I10 Essential (primary) hypertension: Secondary | ICD-10-CM

## 2017-07-01 DIAGNOSIS — E1159 Type 2 diabetes mellitus with other circulatory complications: Secondary | ICD-10-CM

## 2017-07-01 DIAGNOSIS — E669 Obesity, unspecified: Secondary | ICD-10-CM

## 2017-07-01 DIAGNOSIS — E785 Hyperlipidemia, unspecified: Secondary | ICD-10-CM

## 2017-07-01 DIAGNOSIS — I152 Hypertension secondary to endocrine disorders: Secondary | ICD-10-CM

## 2017-07-28 ENCOUNTER — Other Ambulatory Visit: Payer: Self-pay

## 2017-07-28 DIAGNOSIS — E1169 Type 2 diabetes mellitus with other specified complication: Secondary | ICD-10-CM

## 2017-07-28 DIAGNOSIS — E669 Obesity, unspecified: Secondary | ICD-10-CM

## 2017-08-22 ENCOUNTER — Telehealth: Payer: Self-pay | Admitting: Family Medicine

## 2017-08-22 NOTE — Telephone Encounter (Signed)
lmtcb

## 2017-08-22 NOTE — Telephone Encounter (Signed)
Pt given appt with Dr Warrick Parisian 08/29/17 at 10:55.

## 2017-08-29 ENCOUNTER — Ambulatory Visit (INDEPENDENT_AMBULATORY_CARE_PROVIDER_SITE_OTHER): Payer: BLUE CROSS/BLUE SHIELD | Admitting: Family Medicine

## 2017-08-29 ENCOUNTER — Encounter: Payer: Self-pay | Admitting: Family Medicine

## 2017-08-29 VITALS — BP 107/67 | HR 72 | Temp 97.4°F | Ht 77.0 in | Wt 300.0 lb

## 2017-08-29 DIAGNOSIS — L03032 Cellulitis of left toe: Secondary | ICD-10-CM

## 2017-08-29 DIAGNOSIS — M545 Low back pain, unspecified: Secondary | ICD-10-CM

## 2017-08-29 MED ORDER — DOXYCYCLINE HYCLATE 100 MG PO TABS: 100.0000 mg | ORAL_TABLET | Freq: Two times a day (BID) | ORAL | 0 refills | Status: DC

## 2017-08-29 MED ORDER — MELOXICAM 15 MG PO TABS: 15.0000 mg | ORAL_TABLET | Freq: Every day | ORAL | 0 refills | Status: DC

## 2017-08-29 MED ORDER — OXYCODONE-ACETAMINOPHEN 10-325 MG PO TABS: 1.0000 | ORAL_TABLET | Freq: Three times a day (TID) | ORAL | 0 refills | Status: DC | PRN

## 2017-08-29 NOTE — Progress Notes (Signed)
BP 107/67   Pulse 72   Temp (!) 97.4 F (36.3 C) (Oral)   Ht 6\' 5"  (1.956 m)   Wt 300 lb (136.1 kg)   BMI 35.57 kg/m    Subjective:    Patient ID: Matthew Stewart, male    DOB: Feb 08, 1961, 57 y.o.   MRN: [id number]  HPI: Matthew Stewart is a 57 y.o. male presenting on 08/29/2017 for ER follow up (happened on 08/16/2017, slipped in mud and fell on back, still having pain in lumbar region; given Hydrocodone, Flexeril and Prednisone, completed all) and Redness around left great toe   HPI ER follow-up for back pain Patient is coming in for an ER follow-up for back pain.  He does have known chronic back pain but it is worse than normal.  On 08/16/2017 he slipped in the mud and fell on his back since doing this he is still having a lot of pain in his lower back.  From the hospital he was given hydrocodone and Flexeril and prednisone and he completed all of them except he still does have some Flexeril and feels like that may have helped a little bit but did not resolve the issue.  He says when he fell he fell and hit an object right in the middle of his lower back and has bruising and swelling in his lower back.  He denies any new numbness or weakness or any shooting pains going down either of his legs.  He rates the pain as moderate to severe.  Patient had x-rays at ED  Left great toe redness and swelling around base of the bed of the toenail Patient is coming in with complaints of swelling and redness around the left great toe and having some pain there although he does have known diabetic neuropathy and does not necessarily feel his toes completely but he has not redness and swelling around that toe and was concerned about infection.  He says he just noticed it over the past couple days but does not know if it was there prior to that.  He denies any fevers or chills or swelling or redness or warmth anywhere else around the foot or leg but just mostly around the base of his toenail of the left great  toe.  He usually cannot feel anything but he does actually feel some pain from the swelling in there.  Relevant past medical, surgical, family and social history reviewed and updated as indicated. Interim medical history since our last visit reviewed. Allergies and medications reviewed and updated.  Review of Systems  Constitutional: Negative for chills and fever.  Respiratory: Negative for shortness of breath and wheezing.   Cardiovascular: Negative for chest pain and leg swelling.  Musculoskeletal: Positive for back pain. Negative for gait problem and neck pain.  Skin: Positive for color change. Negative for rash.  Neurological: Negative for dizziness, weakness, numbness and headaches.  All other systems reviewed and are negative.   Per HPI unless specifically indicated above     Objective:    BP 107/67   Pulse 72   Temp (!) 97.4 F (36.3 C) (Oral)   Ht 6\' 5"  (1.956 m)   Wt 300 lb (136.1 kg)   BMI 35.57 kg/m   Wt Readings from Last 3 Encounters:  08/29/17 300 lb (136.1 kg)  04/30/17 298 lb (135.2 kg)  04/10/17 298 lb (135.2 kg)    Physical Exam  Constitutional: He is oriented to person, place, and time. He appears well-developed and  well-nourished. No distress.  Eyes: Conjunctivae are normal. No scleral icterus.  Cardiovascular: Normal rate, regular rhythm, normal heart sounds and intact distal pulses.  No murmur heard. Pulmonary/Chest: Effort normal and breath sounds normal. No respiratory distress. He has no wheezes. He has no rales.  Musculoskeletal: Normal range of motion. He exhibits no edema.       Lumbar back: He exhibits tenderness (Bilateral paraspinal tenderness in a bandlike area across lower back with bruising and swelling over his lower back.  Negative straight leg raise bilaterally.) and swelling. He exhibits normal range of motion and no bony tenderness.  Neurological: He is alert and oriented to person, place, and time. Coordination normal.  Skin: Skin is  warm and dry. No rash noted. He is not diaphoretic. There is erythema (Swelling and erythema over the proximal paronychia of his left great toe with some induration and fluctuation noted.  Difficult to assess the depth of it because it is near the nailbed.).  Psychiatric: He has a normal mood and affect. His behavior is normal.  Nursing note and vitals reviewed.       Assessment & Plan:   Problem List Items Addressed This Visit    None    Visit Diagnoses    Lumbar pain    -  Primary   Left-sided sciatica, also swelling and bruising over lower back, will give anti-inflammatory and pain medication to get him through   Relevant Medications   oxyCODONE-acetaminophen (PERCOCET) 10-325 MG tablet   meloxicam (MOBIC) 15 MG tablet   Cellulitis of left toe       Relevant Medications   doxycycline (VIBRA-TABS) 100 MG tablet      Recommended for patient to have a very short leash on his toe and keep a close eye on it every day and if it is not improving or starts to worsen then he needs to return and we possibly need to get him in for surgery.  Follow up plan: Return if symptoms worsen or fail to improve.  Counseling provided for all of the vaccine components No orders of the defined types were placed in this encounter.   [name], MD [facility] 08/29/2017, 12:03 PM

## 2017-09-04 ENCOUNTER — Encounter: Payer: BLUE CROSS/BLUE SHIELD | Attending: Family Medicine | Admitting: Nutrition

## 2017-09-04 ENCOUNTER — Encounter: Payer: Self-pay | Admitting: Nutrition

## 2017-09-04 VITALS — Ht 76.0 in | Wt 294.0 lb

## 2017-09-04 DIAGNOSIS — E1165 Type 2 diabetes mellitus with hyperglycemia: Secondary | ICD-10-CM

## 2017-09-04 DIAGNOSIS — Z6835 Body mass index (BMI) 35.0-35.9, adult: Secondary | ICD-10-CM | POA: Insufficient documentation

## 2017-09-04 DIAGNOSIS — E7800 Pure hypercholesterolemia, unspecified: Secondary | ICD-10-CM

## 2017-09-04 DIAGNOSIS — Z713 Dietary counseling and surveillance: Secondary | ICD-10-CM | POA: Diagnosis not present

## 2017-09-04 DIAGNOSIS — E669 Obesity, unspecified: Secondary | ICD-10-CM | POA: Diagnosis not present

## 2017-09-04 DIAGNOSIS — IMO0002 Reserved for concepts with insufficient information to code with codable children: Secondary | ICD-10-CM

## 2017-09-04 DIAGNOSIS — E118 Type 2 diabetes mellitus with unspecified complications: Secondary | ICD-10-CM

## 2017-09-04 DIAGNOSIS — E1169 Type 2 diabetes mellitus with other specified complication: Secondary | ICD-10-CM | POA: Diagnosis not present

## 2017-09-04 NOTE — Patient Instructions (Addendum)
Goals 1. Follow Plate Method 2. 3-4 carb choices per meals 3. Increase water to gallon per day/ 4. Cut back milk and choose 2 % milk 5. Increase fresh fruits and vegetables. 6. Get A1C to 7% or less
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
## 2017-09-11 ENCOUNTER — Telehealth: Payer: Self-pay

## 2017-09-11 DIAGNOSIS — M545 Low back pain, unspecified: Secondary | ICD-10-CM

## 2017-09-11 NOTE — Telephone Encounter (Signed)
Back not better  You said you'd order an MRI if not better

## 2017-09-12 NOTE — Telephone Encounter (Signed)
Patients wife aware

## 2017-09-12 NOTE — Addendum Note (Signed)
Addended by: [name] on: 09/12/2017 04:35 PM   Modules accepted: Orders

## 2017-09-25 ENCOUNTER — Telehealth: Payer: Self-pay | Admitting: Family Medicine

## 2017-09-25 ENCOUNTER — Encounter: Payer: Self-pay | Admitting: *Deleted

## 2017-09-25 ENCOUNTER — Ambulatory Visit
Admission: RE | Admit: 2017-09-25 | Discharge: 2017-09-25 | Disposition: A | Discharge status: Home / Self Care | Payer: BLUE CROSS/BLUE SHIELD | Source: Ambulatory Visit | Attending: Family Medicine | Admitting: Family Medicine

## 2017-09-25 DIAGNOSIS — M545 Low back pain, unspecified: Secondary | ICD-10-CM

## 2017-09-25 DIAGNOSIS — M5410 Radiculopathy, site unspecified: Secondary | ICD-10-CM

## 2017-09-25 MED ORDER — MELOXICAM 15 MG PO TABS: 15.0000 mg | ORAL_TABLET | Freq: Every day | ORAL | 1 refills | Status: DC
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
## 2017-09-25 NOTE — Telephone Encounter (Signed)
Please let the patient know that I have sent more meloxicam for him but if he needs more of Percocet that can only be given out during an exam visit per office policy. [name], MD [facility] 09/25/2017, 2:34 PM

## 2017-09-25 NOTE — Telephone Encounter (Signed)
Pt aware appt made

## 2017-09-26 ENCOUNTER — Encounter: Payer: Self-pay | Admitting: Family Medicine

## 2017-09-26 ENCOUNTER — Ambulatory Visit (INDEPENDENT_AMBULATORY_CARE_PROVIDER_SITE_OTHER): Payer: BLUE CROSS/BLUE SHIELD | Admitting: Family Medicine

## 2017-09-26 DIAGNOSIS — M545 Low back pain, unspecified: Secondary | ICD-10-CM

## 2017-09-26 MED ORDER — OXYCODONE-ACETAMINOPHEN 10-325 MG PO TABS: 1.0000 | ORAL_TABLET | Freq: Three times a day (TID) | ORAL | 0 refills | Status: DC | PRN

## 2017-09-26 NOTE — Progress Notes (Signed)
BP 119/67   Pulse 67   Temp 98 F (36.7 C) (Oral)   Ht 6\' 4"  (1.93 m)   Wt 290 lb 8 oz (131.8 kg)   BMI 35.36 kg/m    Subjective:    Patient ID: Matthew Stewart, male    DOB: [birth year], 57 y.o.   MRN: [id number]  HPI: Matthew Stewart is a 57 y.o. male presenting on 09/26/2017 for Back Pain, numbness in legs (had MRI yesterday, out of pain medication)   HPI Low back pain and numbness in legs Patient comes in complaining of low back pain bilateral with numbness going down both of his legs now.  He says if he is standing or walking he is fine but he soon as he twists or sits down for long period of time shooting pain going down both of his legs and the numbness going all the way down to his feet will start up.  He had an MRI yesterday which showed that he has bulging disks and degeneration but did not show any specific nerve compression but with his symptoms we are trying to get him into neurosurgery as quickly as we can.  He has been using meloxicam and pain medication to help with this.  He says that he has been out of the pain medication for the past 2 days and his pain has been a lot more severe and is keeping him from doing almost anything.  Relevant past medical, surgical, family and social history reviewed and updated as indicated. Interim medical history since our last visit reviewed. Allergies and medications reviewed and updated.  Review of Systems  Constitutional: Negative for chills and fever.  Respiratory: Negative for shortness of breath and wheezing.   Cardiovascular: Negative for chest pain and leg swelling.  Musculoskeletal: Positive for back pain. Negative for gait problem.  Skin: Negative for color change and rash.  Neurological: Positive for weakness and numbness.  All other systems reviewed and are negative.   Per HPI unless specifically indicated above        Objective:    BP 119/67   Pulse 67   Temp 98 F (36.7 C) (Oral)   Ht 6\' 4"  (1.93 m)   Wt 290  lb 8 oz (131.8 kg)   BMI 35.36 kg/m   Wt Readings from Last 3 Encounters:  09/26/17 290 lb 8 oz (131.8 kg)  09/04/17 294 lb (133.4 kg)  08/29/17 300 lb (136.1 kg)    Physical Exam  Constitutional: He is oriented to person, place, and time. He appears well-developed and well-nourished. No distress.  Eyes: Conjunctivae are normal. No scleral icterus.  Musculoskeletal: Normal range of motion. He exhibits tenderness (Lower back tenderness).  Bilateral lower extremity 4 out of 5 strength, no numbness on exam today.  Pain worse with rotation of upper body  Neurological: He is alert and oriented to person, place, and time. Coordination normal.  Skin: Skin is warm and dry. No rash noted. He is not diaphoretic.  Psychiatric: He has a normal mood and affect. His behavior is normal.  Nursing note and vitals reviewed.   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  Counseling provided for all of the vaccine components No orders of the defined types were placed in this encounter.   [name], MD [facility] 09/26/2017, 11:07 AM

## 2017-10-09 ENCOUNTER — Telehealth: Payer: Self-pay

## 2017-10-09 NOTE — Telephone Encounter (Signed)
You order an MRI on this patient and Medicaid was prior authorized to do this test but when patient showed up for test he had [employer] also  We did MRI and started process for MRI but it denied   We need to see if you will do a  Peer to peer review to try and get it authorized  His [facility] # is [id number]  The test was [id number] MRI lumbar without    Phone # [phone]   Date of service was 09/25/17

## 2017-10-25 ENCOUNTER — Other Ambulatory Visit: Payer: Self-pay | Admitting: Family Medicine

## 2017-10-25 DIAGNOSIS — E1159 Type 2 diabetes mellitus with other circulatory complications: Secondary | ICD-10-CM

## 2017-10-25 DIAGNOSIS — K219 Gastro-esophageal reflux disease without esophagitis: Secondary | ICD-10-CM

## 2017-10-25 DIAGNOSIS — E785 Hyperlipidemia, unspecified: Principal | ICD-10-CM

## 2017-10-25 DIAGNOSIS — I10 Essential (primary) hypertension: Secondary | ICD-10-CM

## 2017-10-25 DIAGNOSIS — I152 Hypertension secondary to endocrine disorders: Secondary | ICD-10-CM

## 2017-10-25 DIAGNOSIS — E1169 Type 2 diabetes mellitus with other specified complication: Secondary | ICD-10-CM

## 2017-11-24 ENCOUNTER — Ambulatory Visit: Payer: BLUE CROSS/BLUE SHIELD | Admitting: Family Medicine

## 2017-11-26 ENCOUNTER — Ambulatory Visit: Payer: BLUE CROSS/BLUE SHIELD | Admitting: Family Medicine

## 2017-12-01 ENCOUNTER — Telehealth: Payer: Self-pay | Admitting: Family Medicine

## 2017-12-01 NOTE — Telephone Encounter (Signed)
Wife given information for [facility]

## 2017-12-01 NOTE — Telephone Encounter (Signed)
Pt wife has called bc pt was scheduled last week for depression and they cancelled the apt but now she wishes they didn't and she has list of numbers for places but she said she isn't getting anywhere with them and would love to speak with a nurse to get some guidance of what she should do

## 2017-12-03 ENCOUNTER — Ambulatory Visit: Payer: Medicaid Other | Admitting: Nutrition

## 2017-12-04 ENCOUNTER — Encounter: Payer: Self-pay | Admitting: Neurology

## 2017-12-04 ENCOUNTER — Ambulatory Visit (INDEPENDENT_AMBULATORY_CARE_PROVIDER_SITE_OTHER): Payer: BLUE CROSS/BLUE SHIELD | Admitting: Family Medicine

## 2017-12-04 ENCOUNTER — Encounter: Payer: Self-pay | Admitting: Family Medicine

## 2017-12-04 VITALS — BP 122/81 | HR 82 | Temp 97.7°F | Ht 76.0 in | Wt 265.0 lb

## 2017-12-04 DIAGNOSIS — R55 Syncope and collapse: Secondary | ICD-10-CM | POA: Diagnosis not present

## 2017-12-04 DIAGNOSIS — R1030 Lower abdominal pain, unspecified: Secondary | ICD-10-CM

## 2017-12-04 DIAGNOSIS — N3000 Acute cystitis without hematuria: Secondary | ICD-10-CM | POA: Diagnosis not present

## 2017-12-04 DIAGNOSIS — R109 Unspecified abdominal pain: Secondary | ICD-10-CM | POA: Diagnosis not present

## 2017-12-04 DIAGNOSIS — N200 Calculus of kidney: Secondary | ICD-10-CM | POA: Diagnosis not present

## 2017-12-04 LAB — URINALYSIS, COMPLETE
Bilirubin, UA: NEGATIVE
KETONES UA: NEGATIVE
Nitrite, UA: POSITIVE — AB
PH UA: 5 (ref 5.0–7.5)
Specific Gravity, UA: 1.015 (ref 1.005–1.030)
Urobilinogen, Ur: 0.2 mg/dL (ref 0.2–1.0)

## 2017-12-04 LAB — MICROSCOPIC EXAMINATION
EPITHELIAL CELLS (NON RENAL): NONE SEEN /HPF (ref 0–10)
RENAL EPITHEL UA: NONE SEEN /HPF
WBC, UA: NONE SEEN /hpf (ref 0–5)

## 2017-12-04 MED ORDER — CEFTRIAXONE SODIUM 1 G IJ SOLR
1.0000 g | Freq: Once | INTRAMUSCULAR | Status: AC
  Administered 2017-12-04: 1 g via INTRAMUSCULAR

## 2017-12-04 MED ORDER — SULFAMETHOXAZOLE-TRIMETHOPRIM 800-160 MG PO TABS
1.0000 | ORAL_TABLET | Freq: Two times a day (BID) | ORAL | 0 refills | Status: DC
Start: 2017-12-04 — End: 2018-03-09
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
## 2017-12-10 ENCOUNTER — Other Ambulatory Visit: Payer: Self-pay

## 2017-12-10 DIAGNOSIS — M799 Soft tissue disorder, unspecified: Secondary | ICD-10-CM

## 2017-12-15 ENCOUNTER — Telehealth: Payer: Self-pay

## 2017-12-15 NOTE — Telephone Encounter (Signed)
Neurology gave [place] an appt in July   Is that too far out?

## 2017-12-15 NOTE — Telephone Encounter (Signed)
I would prefer it to be sooner if possible but if he cannot get it anywhere else sooner than that is okay

## 2017-12-16 ENCOUNTER — Telehealth: Payer: Self-pay | Admitting: Family Medicine

## 2017-12-16 DIAGNOSIS — J439 Emphysema, unspecified: Secondary | ICD-10-CM

## 2017-12-16 MED ORDER — FLUTICASONE FUROATE-VILANTEROL 100-25 MCG/INH IN AEPB: 1.0000 | INHALATION_SPRAY | Freq: Every day | RESPIRATORY_TRACT | 2 refills | Status: DC

## 2017-12-16 NOTE — Telephone Encounter (Signed)
Inhaler sent to the pharmacy

## 2017-12-19 ENCOUNTER — Encounter: Payer: Self-pay | Admitting: Neurology

## 2017-12-19 ENCOUNTER — Ambulatory Visit: Payer: BLUE CROSS/BLUE SHIELD | Admitting: Neurology

## 2017-12-19 ENCOUNTER — Other Ambulatory Visit: Payer: Self-pay

## 2017-12-19 VITALS — BP 136/74 | HR 93 | Ht 76.0 in | Wt 257.0 lb

## 2017-12-19 DIAGNOSIS — Z8249 Family history of ischemic heart disease and other diseases of the circulatory system: Secondary | ICD-10-CM

## 2017-12-19 DIAGNOSIS — R29898 Other symptoms and signs involving the musculoskeletal system: Secondary | ICD-10-CM

## 2017-12-19 DIAGNOSIS — R200 Anesthesia of skin: Secondary | ICD-10-CM | POA: Diagnosis not present

## 2017-12-19 DIAGNOSIS — R55 Syncope and collapse: Secondary | ICD-10-CM
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
## 2017-12-22 ENCOUNTER — Other Ambulatory Visit: Payer: BLUE CROSS/BLUE SHIELD

## 2017-12-23 ENCOUNTER — Telehealth: Payer: Self-pay

## 2017-12-23 DIAGNOSIS — I6529 Occlusion and stenosis of unspecified carotid artery: Secondary | ICD-10-CM

## 2017-12-23 NOTE — Telephone Encounter (Signed)
Let us go ahead and do the referral to vascular surgeon for the patient.  Diagnosis carotid artery stenosis

## 2017-12-23 NOTE — Telephone Encounter (Signed)
Neurology said that [facility] needs to be referred to Vascular surgeon for blockages in neck

## 2017-12-23 NOTE — Telephone Encounter (Signed)
Debbi,  I put in an order for vascular surgery attached

## 2017-12-23 NOTE — Addendum Note (Signed)
Addended by: Michaela Corner on: 12/23/2017 12:58 PM   Modules accepted: Orders

## 2017-12-24 ENCOUNTER — Ambulatory Visit (INDEPENDENT_AMBULATORY_CARE_PROVIDER_SITE_OTHER): Payer: BLUE CROSS/BLUE SHIELD | Admitting: Neurology

## 2017-12-24 DIAGNOSIS — R55 Syncope and collapse: Secondary | ICD-10-CM

## 2017-12-24 DIAGNOSIS — R42 Dizziness and giddiness: Secondary | ICD-10-CM | POA: Diagnosis not present

## 2017-12-24 DIAGNOSIS — Z8249 Family history of ischemic heart disease and other diseases of the circulatory system: Secondary | ICD-10-CM

## 2017-12-25 ENCOUNTER — Other Ambulatory Visit: Payer: Self-pay

## 2017-12-25 DIAGNOSIS — R55 Syncope and collapse: Secondary | ICD-10-CM

## 2017-12-25 DIAGNOSIS — R42 Dizziness and giddiness: Secondary | ICD-10-CM

## 2017-12-29 ENCOUNTER — Telehealth: Payer: Self-pay | Admitting: Family Medicine
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
## 2017-12-30 ENCOUNTER — Ambulatory Visit
Admission: RE | Admit: 2017-12-30 | Discharge: 2017-12-30 | Disposition: A | Discharge status: Home / Self Care | Payer: BLUE CROSS/BLUE SHIELD | Source: Ambulatory Visit | Attending: Neurology | Admitting: Neurology

## 2017-12-30 DIAGNOSIS — R29898 Other symptoms and signs involving the musculoskeletal system: Secondary | ICD-10-CM

## 2017-12-30 DIAGNOSIS — Z8249 Family history of ischemic heart disease and other diseases of the circulatory system: Secondary | ICD-10-CM

## 2017-12-30 DIAGNOSIS — R200 Anesthesia of skin: Secondary | ICD-10-CM

## 2017-12-30 DIAGNOSIS — R55 Syncope and collapse: Secondary | ICD-10-CM

## 2017-12-30 MED ORDER — IOPAMIDOL (ISOVUE-370) INJECTION 76%
75.0000 mL | Freq: Once | INTRAVENOUS | Status: AC | PRN
  Administered 2017-12-30: 75 mL via INTRAVENOUS

## 2017-12-30 NOTE — Telephone Encounter (Signed)
Don't understand what wife is upset about  Patient was seen by neurology on 12/19/17

## 2017-12-31 ENCOUNTER — Ambulatory Visit (INDEPENDENT_AMBULATORY_CARE_PROVIDER_SITE_OTHER): Payer: BLUE CROSS/BLUE SHIELD | Admitting: Vascular Surgery

## 2017-12-31 ENCOUNTER — Other Ambulatory Visit: Payer: Self-pay

## 2017-12-31 ENCOUNTER — Encounter: Payer: Self-pay | Admitting: Vascular Surgery

## 2017-12-31 DIAGNOSIS — R55 Syncope and collapse: Secondary | ICD-10-CM

## 2017-12-31 DIAGNOSIS — I779 Disorder of arteries and arterioles, unspecified: Secondary | ICD-10-CM
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
## 2017-12-31 NOTE — Telephone Encounter (Signed)
I do not understand which referral this is talked about, as is the vascular surgeon referral for carotid stenosis?

## 2017-12-31 NOTE — Telephone Encounter (Signed)
Matthew Stewart spoke to patient - Apt moved to today at 2pm.

## 2017-12-31 NOTE — Telephone Encounter (Signed)
lmtcb- debbie is going to see if she can get the Vascular apt moved up sooner.

## 2018-01-01 ENCOUNTER — Other Ambulatory Visit: Payer: Self-pay

## 2018-01-01 ENCOUNTER — Telehealth: Payer: Self-pay

## 2018-01-01 DIAGNOSIS — I779 Disorder of arteries and arterioles, unspecified: Secondary | ICD-10-CM

## 2018-01-01 NOTE — Telephone Encounter (Signed)
-----   Message from Cameron Sprang, MD sent at 12/31/2017  3:17 PM EDT ----- Pls let him know the CT of head and blood vessels was normal, brain wave test was normal. Would do an MRI brain without contrast to further evaluate his left-sided symptoms. Thanks

## 2018-01-01 NOTE — Telephone Encounter (Signed)
Spoke with pt relaying message below.  He states that his vascular doctor is ordering some "stuff" for his left-side low blood pressure and would like to hold off on ordering MRI at this time.

## 2018-01-06 DIAGNOSIS — R4781 Slurred speech: Secondary | ICD-10-CM | POA: Insufficient documentation

## 2018-01-07 ENCOUNTER — Encounter (HOSPITAL_COMMUNITY): Payer: BLUE CROSS/BLUE SHIELD

## 2018-01-08 ENCOUNTER — Other Ambulatory Visit: Payer: Self-pay | Admitting: Family Medicine

## 2018-01-08 DIAGNOSIS — E669 Obesity, unspecified: Secondary | ICD-10-CM

## 2018-01-08 DIAGNOSIS — E1169 Type 2 diabetes mellitus with other specified complication: Secondary | ICD-10-CM

## 2018-01-14 ENCOUNTER — Ambulatory Visit: Payer: BLUE CROSS/BLUE SHIELD | Admitting: Vascular Surgery

## 2018-01-16 ENCOUNTER — Ambulatory Visit: Payer: BLUE CROSS/BLUE SHIELD | Admitting: Neurology

## 2018-01-22 ENCOUNTER — Other Ambulatory Visit: Payer: Self-pay | Admitting: Family Medicine

## 2018-01-22 DIAGNOSIS — E669 Obesity, unspecified: Secondary | ICD-10-CM

## 2018-01-22 DIAGNOSIS — E1169 Type 2 diabetes mellitus with other specified complication: Secondary | ICD-10-CM

## 2018-01-23 ENCOUNTER — Encounter: Payer: BLUE CROSS/BLUE SHIELD | Admitting: Vascular Surgery

## 2018-02-13 ENCOUNTER — Other Ambulatory Visit: Payer: Self-pay | Admitting: Family Medicine

## 2018-02-13 DIAGNOSIS — K219 Gastro-esophageal reflux disease without esophagitis: Secondary | ICD-10-CM

## 2018-02-25 ENCOUNTER — Ambulatory Visit: Payer: BLUE CROSS/BLUE SHIELD | Admitting: Neurology

## 2018-02-25 ENCOUNTER — Encounter

## 2018-03-09 ENCOUNTER — Encounter: Payer: Self-pay | Admitting: Family Medicine

## 2018-03-09 ENCOUNTER — Ambulatory Visit (INDEPENDENT_AMBULATORY_CARE_PROVIDER_SITE_OTHER): Payer: BLUE CROSS/BLUE SHIELD | Admitting: Family Medicine

## 2018-03-09 VITALS — BP 113/78 | HR 71 | Temp 97.8°F | Ht 76.0 in | Wt 276.0 lb

## 2018-03-09 DIAGNOSIS — I10 Essential (primary) hypertension: Secondary | ICD-10-CM | POA: Diagnosis not present

## 2018-03-09 DIAGNOSIS — E1159 Type 2 diabetes mellitus with other circulatory complications: Secondary | ICD-10-CM

## 2018-03-09 DIAGNOSIS — K219 Gastro-esophageal reflux disease without esophagitis: Secondary | ICD-10-CM | POA: Diagnosis not present

## 2018-03-09 DIAGNOSIS — E669 Obesity, unspecified: Secondary | ICD-10-CM

## 2018-03-09 DIAGNOSIS — I152 Hypertension secondary to endocrine disorders: Secondary | ICD-10-CM

## 2018-03-09 DIAGNOSIS — E1169 Type 2 diabetes mellitus with other specified complication: Secondary | ICD-10-CM

## 2018-03-09 DIAGNOSIS — J439 Emphysema, unspecified: Secondary | ICD-10-CM | POA: Diagnosis not present

## 2018-03-09 DIAGNOSIS — E785 Hyperlipidemia, unspecified: Secondary | ICD-10-CM

## 2018-03-09 LAB — BAYER DCA HB A1C WAIVED: HB A1C (BAYER DCA - WAIVED): 7.4 % — ABNORMAL HIGH (ref <7.0)

## 2018-03-09 MED ORDER — RANITIDINE HCL 300 MG PO TABS: 300.0000 mg | ORAL_TABLET | Freq: Every day | ORAL | 3 refills | Status: DC

## 2018-03-09 NOTE — Progress Notes (Signed)
BP 113/78   Pulse 71   Temp 97.8 F (36.6 C) (Oral)   Ht '6\' 4"'  (1.93 m)   Wt 276 lb (125.2 kg)   BMI 33.60 kg/m    Subjective:    Patient ID: Matthew Stewart, male    DOB: 21-Jan-1961, 57 y.o.   MRN: [id number]  HPI: Matthew Stewart is a 57 y.o. male presenting on 03/09/2018 for Refill Ranitidine and Discuss inhalers (had allergic reaction to [facility], needs to be prescribed ProAir)   HPI GERD Patient is currently on Zantac.  She denies any major symptoms or abdominal pain or belching or burping. She denies any blood in her stool or lightheadedness or dizziness.   Hypertension Patient is currently on metoprolol, and their blood pressure today is 113/78. Patient denies any lightheadedness or dizziness. Patient denies headaches, blurred vision, chest pains, shortness of breath, or weakness. Denies any side effects from medication and is content with current medication.   COPD Patient is coming in for COPD recheck today.  He is currently on Brio but feels like he had a reaction to it with sores in his mouth and has had that with other inhalers before and he feels like he is doing fine without it and would like to just go without an inhaler for now..  He has a mild chronic cough but denies any major coughing spells or wheezing spells.  He has 0nighttime symptoms per week and 0daytime symptoms per week currently.   Hyperlipidemia Patient is coming in for recheck of his hyperlipidemia. The patient is currently taking pravastatin. They deny any issues with myalgias or history of liver damage from it. They deny any focal numbness or weakness or chest pain.   Type 2 diabetes mellitus Patient comes in today for recheck of his diabetes. Patient has been currently taking metformin and Invokana. Patient is not currently on an ACE inhibitor/ARB because his blood pressure did not tolerate it. Patient has not seen an ophthalmologist this year. Patient denies any issues with their feet.  Patient is obese and  has hypertension and cholesterol issues  Relevant past medical, surgical, family and social history reviewed and updated as indicated. Interim medical history since our last visit reviewed. Allergies and medications reviewed and updated.  Review of Systems  Constitutional: Negative for chills and fever.  Respiratory: Negative for shortness of breath and wheezing.   Cardiovascular: Negative for chest pain and leg swelling.  Musculoskeletal: Negative for back pain and gait problem.  Skin: Negative for rash.  Neurological: Negative for dizziness, weakness and light-headedness.  All other systems reviewed and are negative.   Per HPI unless specifically indicated above   Allergies as of 03/09/2018      Reactions   Methocarbamol Nausea Only   Breo Ellipta [fluticasone Furoate-vilanterol] Rash   Caused rash around mouth   Spiriva Handihaler [tiotropium Bromide Monohydrate] Rash      Medication List        Accurate as of 03/09/18 11:54 AM. Always use your most recent med list.          aspirin 81 MG chewable tablet Chew 81 mg by mouth daily.   INVOKANA 300 MG Tabs tablet Generic drug:  canagliflozin TAKE 1 TABLET BY MOUTH DAILY   metFORMIN 500 MG 24 hr tablet Commonly known as:  GLUCOPHAGE-XR TAKE 1 TABLET BY MOUTH TWICE DAILY.   metoprolol succinate 25 MG 24 hr tablet Commonly known as:  TOPROL-XL TAKE 1 TABLET BY MOUTH ONCE DAILY   pravastatin  20 MG tablet Commonly known as:  PRAVACHOL TAKE 1 TABLET BY MOUTH ONCE DAILY   ranitidine 300 MG tablet Commonly known as:  ZANTAC Take 1 tablet (300 mg total) by mouth at bedtime.          Objective:    BP 113/78   Pulse 71   Temp 97.8 F (36.6 C) (Oral)   Ht '6\' 4"'  (1.93 m)   Wt 276 lb (125.2 kg)   BMI 33.60 kg/m   Wt Readings from Last 3 Encounters:  03/09/18 276 lb (125.2 kg)  12/31/17 258 lb (117 kg)  12/19/17 257 lb (116.6 kg)    Physical Exam  Constitutional: He is oriented to person, place, and time.  He appears well-developed and well-nourished. No distress.  Eyes: Conjunctivae are normal. No scleral icterus.  Neck: Neck supple. No thyromegaly present.  Cardiovascular: Normal rate, regular rhythm, normal heart sounds and intact distal pulses.  No murmur heard. Pulmonary/Chest: Effort normal and breath sounds normal. No respiratory distress. He has no wheezes.  Musculoskeletal: Normal range of motion. He exhibits no edema.  Lymphadenopathy:    He has no cervical adenopathy.  Neurological: He is alert and oriented to person, place, and time. Coordination normal.  Skin: Skin is warm and dry. No rash noted. He is not diaphoretic.  Psychiatric: He has a normal mood and affect. His behavior is normal.  Nursing note and vitals reviewed.       Assessment & Plan:   Problem List Items Addressed This Visit      Cardiovascular and Mediastinum   Hypertension associated with diabetes ([place])   Relevant Orders   CMP14+EGFR (Completed)     Respiratory   COPD (chronic obstructive pulmonary disease) ([place])   Relevant Orders   CBC with Differential/Platelet (Completed)     Digestive   GERD (gastroesophageal reflux disease)   Relevant Medications   ranitidine (ZANTAC) 300 MG tablet   Other Relevant Orders   CBC with Differential/Platelet (Completed)     Endocrine   Diabetes mellitus type 2 in obese ([place]) - Primary   Relevant Orders   Bayer DCA Hb A1c Waived (Completed)   CMP14+EGFR (Completed)   Hyperlipidemia associated with type 2 diabetes mellitus ([place])   Relevant Orders   Lipid panel (Completed)       Follow up plan: Return in about 3 months (around 06/09/2018), or if symptoms worsen or fail to improve, for Recheck diabetes.  Counseling provided for all of the vaccine components Orders Placed This Encounter  Procedures  . Bayer DCA Hb A1c Waived  . CMP14+EGFR  . CBC with Differential/Platelet  . Lipid panel    Matthew Pina, MD [facility] 03/09/2018, 11:54 AM

## 2018-03-10 LAB — LIPID PANEL
CHOL/HDL RATIO: 4.5 ratio (ref 0.0–5.0)
Cholesterol, Total: 178 mg/dL (ref 100–199)
HDL: 40 mg/dL (ref >39)
LDL CALC: 82 mg/dL (ref 0–99)
TRIGLYCERIDES: 282 mg/dL — AB (ref 0–149)
VLDL Cholesterol Cal: 56 mg/dL — ABNORMAL HIGH (ref 5–40)

## 2018-03-10 LAB — CMP14+EGFR
ALBUMIN: 4.2 g/dL (ref 3.5–5.5)
ALK PHOS: 75 IU/L (ref 39–117)
ALT: 16 IU/L (ref 0–44)
AST: 17 IU/L (ref 0–40)
Albumin/Globulin Ratio: 1.8 (ref 1.2–2.2)
BILIRUBIN TOTAL: 0.2 mg/dL (ref 0.0–1.2)
BUN / CREAT RATIO: 21 — AB (ref 9–20)
BUN: 15 mg/dL (ref 6–24)
CHLORIDE: 101 mmol/L (ref 96–106)
CO2: 24 mmol/L (ref 20–29)
CREATININE: 0.73 mg/dL — AB (ref 0.76–1.27)
Calcium: 9.5 mg/dL (ref 8.7–10.2)
GFR calc Af Amer: 120 mL/min/{1.73_m2} (ref >59)
GFR calc non Af Amer: 104 mL/min/{1.73_m2} (ref >59)
GLUCOSE: 136 mg/dL — AB (ref 65–99)
Globulin, Total: 2.4 g/dL (ref 1.5–4.5)
Potassium: 4.5 mmol/L (ref 3.5–5.2)
Sodium: 139 mmol/L (ref 134–144)
Total Protein: 6.6 g/dL (ref 6.0–8.5)

## 2018-03-10 LAB — CBC WITH DIFFERENTIAL/PLATELET
Basophils Absolute: 0.1 10*3/uL (ref 0.0–0.2)
Basos: 1 %
EOS (ABSOLUTE): 0.7 10*3/uL — ABNORMAL HIGH (ref 0.0–0.4)
EOS: 7 %
HEMATOCRIT: 49.5 % (ref 37.5–51.0)
HEMOGLOBIN: 16.6 g/dL (ref 13.0–17.7)
Immature Grans (Abs): 0 10*3/uL (ref 0.0–0.1)
Immature Granulocytes: 0 %
LYMPHS ABS: 3.9 10*3/uL — AB (ref 0.7–3.1)
Lymphs: 37 %
MCH: 30.8 pg (ref 26.6–33.0)
MCHC: 33.5 g/dL (ref 31.5–35.7)
MCV: 92 fL (ref 79–97)
MONOCYTES: 7 %
Monocytes Absolute: 0.7 10*3/uL (ref 0.1–0.9)
NEUTROS PCT: 48 %
Neutrophils Absolute: 5 10*3/uL (ref 1.4–7.0)
Platelets: 246 10*3/uL (ref 150–450)
RBC: 5.39 x10E6/uL (ref 4.14–5.80)
RDW: 14.1 % (ref 12.3–15.4)
WBC: 10.4 10*3/uL (ref 3.4–10.8)

## 2018-03-13 ENCOUNTER — Telehealth: Payer: Self-pay | Admitting: Family Medicine

## 2018-03-13 NOTE — Telephone Encounter (Signed)
Pt wife is wanting to speak to Dr Dettinger nurse about hydroencephalitis testing.

## 2018-03-13 NOTE — Telephone Encounter (Signed)
Spoke with pt's wife and she states pt is c/o headaches where it feels like "his brain is popping out of his head" in the past and dizzy spells and a lot of drainage out of his eyes and she saw something about hydrencephalitis and is concerned her husband might have it. Advised pt's wife she needs to call the neurologist as they missed appt on 02/25/18 due to cardiac problems. Pt's wife voiced understanding and will call Neuro to reschedule appt.

## 2018-03-16 ENCOUNTER — Encounter: Payer: Self-pay | Admitting: Neurology

## 2018-03-16 ENCOUNTER — Ambulatory Visit (INDEPENDENT_AMBULATORY_CARE_PROVIDER_SITE_OTHER): Payer: BLUE CROSS/BLUE SHIELD | Admitting: Neurology

## 2018-03-16 VITALS — BP 116/68 | HR 88 | Ht 76.0 in | Wt 280.0 lb

## 2018-03-16 DIAGNOSIS — H9319 Tinnitus, unspecified ear: Secondary | ICD-10-CM | POA: Diagnosis not present

## 2018-03-16 DIAGNOSIS — R51 Headache: Secondary | ICD-10-CM

## 2018-03-16 DIAGNOSIS — R519 Headache, unspecified: Secondary | ICD-10-CM

## 2018-03-16 DIAGNOSIS — R55 Syncope and collapse: Secondary | ICD-10-CM | POA: Diagnosis not present

## 2018-03-16 MED ORDER — TOPIRAMATE 25 MG PO TABS: ORAL_TABLET | ORAL | 6 refills | Status: DC
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
## 2018-03-16 NOTE — Patient Instructions (Addendum)
1. Schedule MRI brain with and without contrast with cuts through the internal auditory canals (IACs) 2. Start Topamax (Topiramate) 25mg : Take 1 tablet every night for 1 week, then increase to 2 tablet every night 3. Keep a calendar of your headaches 4. Minimize Tylenol or any other over the counter medication to 2-3 times a week, otherwise headaches can worsen 5. Follow-up in 07/29/2018 at 4pm

## 2018-03-17 ENCOUNTER — Telehealth: Payer: Self-pay | Admitting: *Deleted

## 2018-03-17 MED ORDER — ALBUTEROL SULFATE HFA 108 (90 BASE) MCG/ACT IN AERS: 2.0000 | INHALATION_SPRAY | Freq: Four times a day (QID) | RESPIRATORY_TRACT | 2 refills | Status: DC | PRN

## 2018-03-17 NOTE — Telephone Encounter (Signed)
Wife states that Dr. Warrick Parisian was going to send in 'puffer' inhaler to [facility] inhaler when seen on 03/09/18. There is an albuteral in history from Aug 2018, has not had since then Please advise

## 2018-03-17 NOTE — Telephone Encounter (Signed)
Albuterol Prescription sent to pharmacy   

## 2018-04-09 ENCOUNTER — Other Ambulatory Visit: Payer: Self-pay | Admitting: Family Medicine

## 2018-04-09 DIAGNOSIS — E1169 Type 2 diabetes mellitus with other specified complication: Secondary | ICD-10-CM

## 2018-04-09 DIAGNOSIS — E669 Obesity, unspecified: Secondary | ICD-10-CM

## 2018-04-17 ENCOUNTER — Other Ambulatory Visit: Payer: Self-pay | Admitting: Family Medicine

## 2018-04-17 DIAGNOSIS — E1169 Type 2 diabetes mellitus with other specified complication: Secondary | ICD-10-CM

## 2018-04-17 DIAGNOSIS — E669 Obesity, unspecified: Secondary | ICD-10-CM

## 2018-05-18 ENCOUNTER — Other Ambulatory Visit: Payer: Self-pay | Admitting: Orthopedic Surgery

## 2018-05-18 DIAGNOSIS — M5416 Radiculopathy, lumbar region: Secondary | ICD-10-CM

## 2018-05-23 ENCOUNTER — Ambulatory Visit
Admission: RE | Admit: 2018-05-23 | Discharge: 2018-05-23 | Disposition: A | Discharge status: Home / Self Care | Payer: BLUE CROSS/BLUE SHIELD | Source: Ambulatory Visit | Attending: Orthopedic Surgery | Admitting: Orthopedic Surgery

## 2018-05-23 DIAGNOSIS — M5416 Radiculopathy, lumbar region: Secondary | ICD-10-CM

## 2018-06-10 ENCOUNTER — Encounter: Payer: Self-pay | Admitting: Family Medicine

## 2018-06-10 ENCOUNTER — Ambulatory Visit: Payer: BLUE CROSS/BLUE SHIELD | Admitting: Family Medicine

## 2018-06-10 VITALS — BP 123/73 | HR 77 | Temp 97.5°F | Ht 76.0 in | Wt 299.6 lb

## 2018-06-10 DIAGNOSIS — J449 Chronic obstructive pulmonary disease, unspecified: Secondary | ICD-10-CM | POA: Diagnosis not present

## 2018-06-10 DIAGNOSIS — E785 Hyperlipidemia, unspecified: Secondary | ICD-10-CM

## 2018-06-10 DIAGNOSIS — E1159 Type 2 diabetes mellitus with other circulatory complications: Secondary | ICD-10-CM | POA: Diagnosis not present

## 2018-06-10 DIAGNOSIS — E669 Obesity, unspecified: Secondary | ICD-10-CM

## 2018-06-10 DIAGNOSIS — M5136 Other intervertebral disc degeneration, lumbar region: Secondary | ICD-10-CM

## 2018-06-10 DIAGNOSIS — K219 Gastro-esophageal reflux disease without esophagitis: Secondary | ICD-10-CM | POA: Diagnosis not present

## 2018-06-10 DIAGNOSIS — E1169 Type 2 diabetes mellitus with other specified complication: Secondary | ICD-10-CM

## 2018-06-10 DIAGNOSIS — I10 Essential (primary) hypertension: Secondary | ICD-10-CM

## 2018-06-10 DIAGNOSIS — I152 Hypertension secondary to endocrine disorders: Secondary | ICD-10-CM

## 2018-06-10 MED ORDER — METOPROLOL SUCCINATE ER 25 MG PO TB24: 25.0000 mg | ORAL_TABLET | Freq: Every day | ORAL | 3 refills | Status: DC

## 2018-06-10 MED ORDER — SEMAGLUTIDE(0.25 OR 0.5MG/DOS) 2 MG/1.5ML ~~LOC~~ SOPN: 0.2500 mg | PEN_INJECTOR | SUBCUTANEOUS | 4 refills | Status: DC

## 2018-06-10 MED ORDER — PRAVASTATIN SODIUM 20 MG PO TABS: 20.0000 mg | ORAL_TABLET | Freq: Every day | ORAL | 3 refills | Status: DC

## 2018-06-10 MED ORDER — TOPIRAMATE 25 MG PO TABS
ORAL_TABLET | ORAL | 6 refills | Status: DC
Start: 2018-06-10 — End: 2018-09-18

## 2018-06-10 MED ORDER — METFORMIN HCL ER 500 MG PO TB24: 500.0000 mg | ORAL_TABLET | Freq: Two times a day (BID) | ORAL | 3 refills | Status: DC

## 2018-06-10 MED ORDER — FAMOTIDINE 20 MG PO TABS: 20.0000 mg | ORAL_TABLET | Freq: Two times a day (BID) | ORAL | 3 refills | Status: DC

## 2018-06-10 NOTE — Progress Notes (Signed)
BP 123/73   Pulse 77   Temp (!) 97.5 F (36.4 C) (Oral)   Ht 6\' 4"  (1.93 m)   Wt 299 lb 9.6 oz (135.9 kg)   BMI 36.47 kg/m    Subjective:    Patient ID: Matthew Stewart, male    DOB: 05-05-1961, 57 y.o.   MRN: [id number]  HPI: Matthew Stewart is a 57 y.o. male presenting on 06/10/2018 for Diabetes (3 month follow up); Hypertension; Referral ([facility]- Dr. Lurene Shadow- patient would like it for his lower spine - Patient states he has been having sprine problems x 9 years.); and Heartburn (Would like ranitidine changed to something else due to it could cause different kinds of cancers and would also also like Invokana changed due to it making him have to use urinate all the time.)   HPI Type 2 diabetes mellitus Patient comes in today for recheck of his diabetes. Patient has been currently taking metformin and Invokana but had stopped Invokana a few weeks ago and has been on prednisone for his back so his sugars have been running in the 400s recently. Patient is not currently on an ACE inhibitor/ARB. Patient has not seen an ophthalmologist this year. Patient denies any issues with their feet.   Hypertension Patient is currently on metoprolol, and their blood pressure today is 123/73. Patient denies any lightheadedness or dizziness. Patient denies headaches, blurred vision, chest pains, shortness of breath, or weakness. Denies any side effects from medication and is content with current medication.   Hyperlipidemia Patient is coming in for recheck of his hyperlipidemia. The patient is currently taking pravastatin. They deny any issues with myalgias or history of liver damage from it. They deny any focal numbness or weakness or chest pain.   GERD Patient is currently on ranitidine but has heard some things about MDMA contamination with ranitidine and would like to be switched to something different, it has been working for him.  She denies any major symptoms or abdominal pain or belching or  burping. She denies any blood in her stool or lightheadedness or dizziness.   COPD Patient is coming in for COPD recheck today.  He is currently on albuterol as needed and has not had to use it recently and says he is doing very well.  He has a mild chronic cough but denies any major coughing spells or wheezing spells.  He has 0nighttime symptoms per week and 1daytime symptoms per week currently.   Chronic back pain Patient is coming in with chronic low back pain that he has been seen by an orthopedic for this and wants a second opinion with a different orthopedic in [place] and has already spoken with her office but just needs [place] to place a referral.  Relevant past medical, surgical, family and social history reviewed and updated as indicated. Interim medical history since our last visit reviewed. Allergies and medications reviewed and updated.  Review of Systems  Constitutional: Negative for chills and fever.  Eyes: Negative for visual disturbance.  Respiratory: Positive for cough and wheezing. Negative for chest tightness and shortness of breath.   Cardiovascular: Negative for chest pain and leg swelling.  Musculoskeletal: Positive for back pain. Negative for gait problem.  Skin: Negative for rash.  Neurological: Positive for numbness. Negative for weakness.  All other systems reviewed and are negative.   Per HPI unless specifically indicated above   Allergies as of 06/10/2018      Reactions   Methocarbamol Nausea Only   Breo  Ellipta [fluticasone Furoate-vilanterol] Rash   Caused rash around mouth   Spiriva Handihaler [tiotropium Bromide Monohydrate] Rash      Medication List        Accurate as of 06/10/18  9:33 AM. Always use your most recent med list.          albuterol 108 (90 Base) MCG/ACT inhaler Commonly known as:  PROVENTIL HFA;VENTOLIN HFA Inhale 2 puffs into the lungs every 6 (six) hours as needed for wheezing or shortness of breath.   aspirin 81 MG  chewable tablet Chew 81 mg by mouth daily.   famotidine 20 MG tablet Commonly known as:  PEPCID Take 1 tablet (20 mg total) by mouth 2 (two) times daily.   metFORMIN 500 MG 24 hr tablet Commonly known as:  GLUCOPHAGE-XR Take 1 tablet (500 mg total) by mouth 2 (two) times daily.   metoprolol succinate 25 MG 24 hr tablet Commonly known as:  TOPROL-XL Take 1 tablet (25 mg total) by mouth daily.   pravastatin 20 MG tablet Commonly known as:  PRAVACHOL Take 1 tablet (20 mg total) by mouth daily.   Semaglutide(0.25 or 0.5MG /DOS) 2 MG/1.5ML Sopn Inject 0.25 mg into the skin once a week.   topiramate 25 MG tablet Commonly known as:  TOPAMAX Take 1 tablet nightly for 1 week, then increase to 2 tablets nightly thereafter          Objective:    BP 123/73   Pulse 77   Temp (!) 97.5 F (36.4 C) (Oral)   Ht 6\' 4"  (1.93 m)   Wt 299 lb 9.6 oz (135.9 kg)   BMI 36.47 kg/m   Wt Readings from Last 3 Encounters:  06/10/18 299 lb 9.6 oz (135.9 kg)  03/16/18 280 lb (127 kg)  03/09/18 276 lb (125.2 kg)    Physical Exam  Constitutional: He is oriented to person, place, and time. He appears well-developed and well-nourished. No distress.  Eyes: Conjunctivae are normal. No scleral icterus.  Neck: Neck supple. No thyromegaly present.  Cardiovascular: Normal rate, regular rhythm, normal heart sounds and intact distal pulses.  No murmur heard. Pulmonary/Chest: Effort normal and breath sounds normal. No respiratory distress. He has no wheezes.  Musculoskeletal: Normal range of motion. He exhibits no edema.  Lymphadenopathy:    He has no cervical adenopathy.  Neurological: He is alert and oriented to person, place, and time. Coordination normal.  Skin: Skin is warm and dry. No rash noted. He is not diaphoretic.  Psychiatric: He has a normal mood and affect. His behavior is normal.  Nursing note and vitals reviewed.       Assessment & Plan:   Problem List Items Addressed This Visit       Cardiovascular and Mediastinum   Hypertension associated with diabetes ([place])   Relevant Medications   metFORMIN (GLUCOPHAGE-XR) 500 MG 24 hr tablet   metoprolol succinate (TOPROL-XL) 25 MG 24 hr tablet   pravastatin (PRAVACHOL) 20 MG tablet   Semaglutide,0.25 or 0.5MG /DOS, (OZEMPIC, 0.25 OR 0.5 MG/DOSE,) 2 MG/1.5ML SOPN     Respiratory   COPD (chronic obstructive pulmonary disease) with chronic bronchitis (HCC)     Digestive   GERD (gastroesophageal reflux disease) - Primary   Relevant Medications   famotidine (PEPCID) 20 MG tablet     Endocrine   Diabetes mellitus type 2 in obese (HCC)   Relevant Medications   metFORMIN (GLUCOPHAGE-XR) 500 MG 24 hr tablet   pravastatin (PRAVACHOL) 20 MG tablet   Semaglutide,0.25 or 0.5MG /DOS, (  OZEMPIC, 0.25 OR 0.5 MG/DOSE,) 2 MG/1.5ML SOPN   Other Relevant Orders   Microalbumin / creatinine urine ratio   Bayer DCA Hb A1c Waived   Hyperlipidemia associated with type 2 diabetes mellitus (HCC)   Relevant Medications   metFORMIN (GLUCOPHAGE-XR) 500 MG 24 hr tablet   pravastatin (PRAVACHOL) 20 MG tablet   Semaglutide,0.25 or 0.5MG /DOS, (OZEMPIC, 0.25 OR 0.5 MG/DOSE,) 2 MG/1.5ML SOPN     Musculoskeletal and Integument   Degenerative disc disease, lumbar   Relevant Orders   Ambulatory referral to Orthopedic Surgery      Follow up plan: Return in about 3 months (around 09/10/2018), or if symptoms worsen or fail to improve, for Diabetes and hypertension recheck.  Counseling provided for all of the vaccine components Orders Placed This Encounter  Procedures  . Microalbumin / creatinine urine ratio  . Bayer DCA Hb A1c Waived  . Ambulatory referral to Wanchese Gottfried Standish, MD [place] 06/10/2018, 9:33 AM

## 2018-06-11 LAB — MICROALBUMIN / CREATININE URINE RATIO
CREATININE, UR: 79.7 mg/dL
Microalb/Creat Ratio: 4.8 mg/g creat (ref 0.0–30.0)
Microalbumin, Urine: 3.8 ug/mL

## 2018-06-16 ENCOUNTER — Other Ambulatory Visit: Payer: Self-pay | Admitting: Family Medicine

## 2018-06-16 DIAGNOSIS — E1169 Type 2 diabetes mellitus with other specified complication: Secondary | ICD-10-CM

## 2018-06-16 DIAGNOSIS — E669 Obesity, unspecified: Secondary | ICD-10-CM

## 2018-07-30 DIAGNOSIS — M7138 Other bursal cyst, other site: Secondary | ICD-10-CM | POA: Diagnosis not present

## 2018-07-30 DIAGNOSIS — M48061 Spinal stenosis, lumbar region without neurogenic claudication: Secondary | ICD-10-CM | POA: Diagnosis not present

## 2018-07-30 DIAGNOSIS — M545 Low back pain: Secondary | ICD-10-CM | POA: Diagnosis not present

## 2018-07-30 DIAGNOSIS — M4716 Other spondylosis with myelopathy, lumbar region: Secondary | ICD-10-CM | POA: Diagnosis not present

## 2018-08-11 DIAGNOSIS — M7138 Other bursal cyst, other site: Secondary | ICD-10-CM | POA: Diagnosis not present

## 2018-08-12 ENCOUNTER — Telehealth: Payer: Self-pay | Admitting: Family Medicine

## 2018-08-13 NOTE — Telephone Encounter (Signed)
Yes go ahead and do the Ozempic normally, it is likely just the steroid shot causing the blood sugars to be elevated and the vision is likely because of the blood sugars, continue to monitor and it should come down over the next 4 or 5 days but if not

## 2018-08-13 NOTE — Telephone Encounter (Signed)
Pt did take the Ozempic this morning and also his blood sugars have been elevated since the injection Tuesday morning. Pt states his FBS usually run 110-125 and yesterday FBS 409 and this morning FBS 311. He says he had blurry vision and headaches Tuesday evening and yesterday but his vision is improving and the headache has resolved. Should he be seen or monitor?

## 2018-08-14 NOTE — Telephone Encounter (Signed)
Spoke with pt's wife and advised of MD feedback. She voiced understanding.

## 2018-09-02 ENCOUNTER — Telehealth: Payer: Self-pay

## 2018-09-02 NOTE — Telephone Encounter (Signed)
Prior auth on Ozempic came back as not a covered benefit and excluded from coverage

## 2018-09-03 MED ORDER — DULAGLUTIDE 0.75 MG/0.5ML ~~LOC~~ SOAJ: 0.7500 mg | SUBCUTANEOUS | 3 refills | Status: DC

## 2018-09-03 NOTE — Telephone Encounter (Signed)
Please let the patient know that I sent in Trulicity for him to replace his Ozempic which apparently is not covered

## 2018-09-03 NOTE — Addendum Note (Signed)
Addended by: [name] on: 09/03/2018 02:56 PM   Modules accepted: Orders

## 2018-09-03 NOTE — Telephone Encounter (Signed)
Patient aware.

## 2018-09-10 DIAGNOSIS — Z4509 Encounter for adjustment and management of other cardiac device: Secondary | ICD-10-CM | POA: Diagnosis not present

## 2018-09-15 ENCOUNTER — Telehealth: Payer: Self-pay

## 2018-09-15 NOTE — Telephone Encounter (Signed)
Medicaid non preferred Trulicity   Preferred are Bydureon pen  Byetta pen and Victoza Pen

## 2018-09-16 MED ORDER — EXENATIDE ER 2 MG ~~LOC~~ PEN: 2.0000 mg | PEN_INJECTOR | SUBCUTANEOUS | 11 refills | Status: DC

## 2018-09-16 NOTE — Telephone Encounter (Signed)
Please let patient know that his insurance this year is not covering Trulicity anymore and that they are requiring [place] to try Bydureon and I sent that prescription for him [name], MD [place] 09/16/2018, 10:12 AM

## 2018-09-16 NOTE — Telephone Encounter (Signed)
Left message to please call our office to confirm message. 

## 2018-09-16 NOTE — Addendum Note (Signed)
Addended by: [name] on: 09/16/2018 10:13 AM   Modules accepted: Orders

## 2018-09-17 NOTE — Telephone Encounter (Signed)
x

## 2018-09-18 ENCOUNTER — Encounter (INDEPENDENT_AMBULATORY_CARE_PROVIDER_SITE_OTHER): Payer: Self-pay

## 2018-09-18 ENCOUNTER — Ambulatory Visit: Payer: Medicaid Other | Admitting: Family Medicine

## 2018-09-18 ENCOUNTER — Encounter: Payer: Self-pay | Admitting: Family Medicine

## 2018-09-18 VITALS — BP 117/79 | HR 77 | Temp 96.9°F | Ht 76.0 in | Wt 307.4 lb

## 2018-09-18 DIAGNOSIS — I10 Essential (primary) hypertension: Secondary | ICD-10-CM | POA: Diagnosis not present

## 2018-09-18 DIAGNOSIS — I152 Hypertension secondary to endocrine disorders: Secondary | ICD-10-CM

## 2018-09-18 DIAGNOSIS — R1033 Periumbilical pain: Secondary | ICD-10-CM

## 2018-09-18 DIAGNOSIS — E669 Obesity, unspecified: Secondary | ICD-10-CM | POA: Diagnosis not present

## 2018-09-18 DIAGNOSIS — E1159 Type 2 diabetes mellitus with other circulatory complications: Secondary | ICD-10-CM

## 2018-09-18 DIAGNOSIS — E1169 Type 2 diabetes mellitus with other specified complication: Secondary | ICD-10-CM | POA: Diagnosis not present

## 2018-09-18 DIAGNOSIS — E785 Hyperlipidemia, unspecified: Secondary | ICD-10-CM | POA: Diagnosis not present

## 2018-09-18 DIAGNOSIS — Z800 Family history of malignant neoplasm of digestive organs: Secondary | ICD-10-CM

## 2018-09-18 DIAGNOSIS — K219 Gastro-esophageal reflux disease without esophagitis: Secondary | ICD-10-CM | POA: Diagnosis not present

## 2018-09-18 LAB — CMP14+EGFR
ALT: 37 IU/L (ref 0–44)
AST: 24 IU/L (ref 0–40)
Albumin/Globulin Ratio: 1.7 (ref 1.2–2.2)
Albumin: 4.4 g/dL (ref 3.8–4.9)
Alkaline Phosphatase: 94 IU/L (ref 39–117)
BUN/Creatinine Ratio: 14 (ref 9–20)
BUN: 13 mg/dL (ref 6–24)
Bilirubin Total: 0.3 mg/dL (ref 0.0–1.2)
CO2: 23 mmol/L (ref 20–29)
CREATININE: 0.96 mg/dL (ref 0.76–1.27)
Calcium: 9.6 mg/dL (ref 8.7–10.2)
Chloride: 97 mmol/L (ref 96–106)
GFR calc Af Amer: 102 mL/min/{1.73_m2} (ref >59)
GFR calc non Af Amer: 88 mL/min/{1.73_m2} (ref >59)
Globulin, Total: 2.6 g/dL (ref 1.5–4.5)
Glucose: 342 mg/dL — ABNORMAL HIGH (ref 65–99)
Potassium: 4.5 mmol/L (ref 3.5–5.2)
Sodium: 140 mmol/L (ref 134–144)
TOTAL PROTEIN: 7 g/dL (ref 6.0–8.5)

## 2018-09-18 LAB — CBC WITH DIFFERENTIAL/PLATELET
Basophils Absolute: 0.1 10*3/uL (ref 0.0–0.2)
Basos: 1 %
EOS (ABSOLUTE): 0.3 10*3/uL (ref 0.0–0.4)
Eos: 4 %
Hematocrit: 50.7 % (ref 37.5–51.0)
Hemoglobin: 16.9 g/dL (ref 13.0–17.7)
IMMATURE GRANS (ABS): 0 10*3/uL (ref 0.0–0.1)
Immature Granulocytes: 0 %
LYMPHS: 39 %
Lymphocytes Absolute: 3 10*3/uL (ref 0.7–3.1)
MCH: 31 pg (ref 26.6–33.0)
MCHC: 33.3 g/dL (ref 31.5–35.7)
MCV: 93 fL (ref 79–97)
Monocytes Absolute: 0.6 10*3/uL (ref 0.1–0.9)
Monocytes: 7 %
Neutrophils Absolute: 3.7 10*3/uL (ref 1.4–7.0)
Neutrophils: 49 %
Platelets: 220 10*3/uL (ref 150–450)
RBC: 5.45 x10E6/uL (ref 4.14–5.80)
RDW: 12.4 % (ref 11.6–15.4)
WBC: 7.7 10*3/uL (ref 3.4–10.8)

## 2018-09-18 LAB — LIPID PANEL
Chol/HDL Ratio: 5.5 ratio — ABNORMAL HIGH (ref 0.0–5.0)
Cholesterol, Total: 199 mg/dL (ref 100–199)
HDL: 36 mg/dL — AB (ref >39)
LDL Calculated: 106 mg/dL — ABNORMAL HIGH (ref 0–99)
Triglycerides: 283 mg/dL — ABNORMAL HIGH (ref 0–149)
VLDL CHOLESTEROL CAL: 57 mg/dL — AB (ref 5–40)

## 2018-09-18 LAB — BAYER DCA HB A1C WAIVED: HB A1C (BAYER DCA - WAIVED): 11.9 % — ABNORMAL HIGH (ref <7.0)

## 2018-09-18 MED ORDER — PRAVASTATIN SODIUM 20 MG PO TABS: 20.0000 mg | ORAL_TABLET | Freq: Every day | ORAL | 3 refills | Status: DC

## 2018-09-18 MED ORDER — DULAGLUTIDE 0.75 MG/0.5ML ~~LOC~~ SOAJ: 0.7500 mg | SUBCUTANEOUS | 3 refills | Status: DC

## 2018-09-18 MED ORDER — PANTOPRAZOLE SODIUM 40 MG PO TBEC: 40.0000 mg | DELAYED_RELEASE_TABLET | Freq: Every day | ORAL | 3 refills | Status: DC

## 2018-09-18 MED ORDER — FAMOTIDINE 20 MG PO TABS: 20.0000 mg | ORAL_TABLET | Freq: Two times a day (BID) | ORAL | 3 refills | Status: DC

## 2018-09-18 MED ORDER — METOPROLOL SUCCINATE ER 25 MG PO TB24: 25.0000 mg | ORAL_TABLET | Freq: Every day | ORAL | 3 refills | Status: DC

## 2018-09-18 NOTE — Progress Notes (Signed)
BP 117/79   Pulse 77   Temp (!) 96.9 F (36.1 C) (Oral)   Ht '6\' 4"'  (1.93 m)   Wt (!) 307 lb 6.4 oz (139.4 kg)   BMI 37.42 kg/m    Subjective:    Patient ID: Matthew Stewart, male    DOB: 03/17/1961, 58 y.o.   MRN: [id number]  HPI: Matthew Stewart is a 58 y.o. male presenting on 09/18/2018 for Diabetes (3 month follow up); Hypertension; and Hyperlipidemia   HPI Type 2 diabetes mellitus Patient comes in today for recheck of his diabetes. Patient has been currently taking Trulicity but he has been out of it due to some insurance difficulties and so he started taking his metformin which is abdominal discomfort has not let him take in the past consistently.  Patient is not currently on an ACE inhibitor/ARB. Patient has not seen an ophthalmologist this year. Patient denies any issues with their feet.   GERD Patient is currently on Protonix.    Hypertension Patient is currently on metoprolol, and their blood pressure today is 117/79. Patient denies any lightheadedness or dizziness. Patient denies headaches, blurred vision, chest pains, shortness of breath, or weakness. Denies any side effects from medication and is content with current medication.   Hyperlipidemia Patient is coming in for recheck of his hyperlipidemia. The patient is currently taking pravastatin and aspirin 81 mg. They deny any issues with myalgias or history of liver damage from it. They deny any focal numbness or weakness or chest pain.   Lower and periumbilical abdominal pain and distention Patient complains of periumbilical abdominal pain and distention, he does admit that he has been out of his acid reflux medications and has a lot of belching burping and nausea associated with it but the pain is been coming up and he has been concerned because he has 4 family members including a dad 2 uncles and 1 of his cousins that have had pancreatic cancer within the past couple years  Relevant past medical, surgical, family and  social history reviewed and updated as indicated. Interim medical history since our last visit reviewed. Allergies and medications reviewed and updated.  Review of Systems  Constitutional: Negative for chills and fever.  Respiratory: Negative for shortness of breath and wheezing.   Cardiovascular: Negative for chest pain and leg swelling.  Gastrointestinal: Positive for abdominal pain and nausea. Negative for blood in stool, constipation, diarrhea, rectal pain and vomiting.  Musculoskeletal: Negative for back pain and gait problem.  Skin: Negative for rash.  Neurological: Negative for dizziness, weakness and light-headedness.  All other systems reviewed and are negative.   Per HPI unless specifically indicated above   Allergies as of 09/18/2018      Reactions   Methocarbamol Nausea Only   Breo Ellipta [fluticasone Furoate-vilanterol] Rash   Caused rash around mouth   Spiriva Handihaler [tiotropium Bromide Monohydrate] Rash      Medication List       Accurate as of September 18, 2018 11:59 PM. Always use your most recent med list.        albuterol 108 (90 Base) MCG/ACT inhaler Commonly known as:  PROVENTIL HFA;VENTOLIN HFA Inhale 2 puffs into the lungs every 6 (six) hours as needed for wheezing or shortness of breath.   aspirin 81 MG chewable tablet Chew 81 mg by mouth daily.   Dulaglutide 0.75 MG/0.5ML Sopn Commonly known as:  TRULICITY Inject 9.35 mg into the skin once a week.   famotidine 20 MG tablet Commonly known  as:  PEPCID Take 1 tablet (20 mg total) by mouth 2 (two) times daily.   metoprolol succinate 25 MG 24 hr tablet Commonly known as:  TOPROL-XL Take 1 tablet (25 mg total) by mouth daily.   pantoprazole 40 MG tablet Commonly known as:  PROTONIX Take 1 tablet (40 mg total) by mouth daily.   pravastatin 20 MG tablet Commonly known as:  PRAVACHOL Take 1 tablet (20 mg total) by mouth daily.          Objective:    BP 117/79   Pulse 77   Temp (!)  96.9 F (36.1 C) (Oral)   Ht '6\' 4"'  (1.93 m)   Wt (!) 307 lb 6.4 oz (139.4 kg)   BMI 37.42 kg/m   Wt Readings from Last 3 Encounters:  09/18/18 (!) 307 lb 6.4 oz (139.4 kg)  06/10/18 299 lb 9.6 oz (135.9 kg)  03/16/18 280 lb (127 kg)    Physical Exam Vitals signs and nursing note reviewed.  Constitutional:      General: He is not in acute distress.    Appearance: He is well-developed. He is not diaphoretic.  Eyes:     General: No scleral icterus.       Right eye: No discharge.     Conjunctiva/sclera: Conjunctivae normal.     Pupils: Pupils are equal, round, and reactive to light.  Neck:     Musculoskeletal: Neck supple.     Thyroid: No thyromegaly.  Cardiovascular:     Rate and Rhythm: Normal rate and regular rhythm.     Heart sounds: Normal heart sounds. No murmur.  Pulmonary:     Effort: Pulmonary effort is normal. No respiratory distress.     Breath sounds: Normal breath sounds. No wheezing.  Abdominal:     General: Abdomen is flat. Bowel sounds are normal. There is no distension.     Tenderness: There is abdominal tenderness (Periumbilical abdominal pain). There is no right CVA tenderness, left CVA tenderness, guarding or rebound.  Musculoskeletal: Normal range of motion.  Lymphadenopathy:     Cervical: No cervical adenopathy.  Skin:    General: Skin is warm and dry.     Findings: No rash.  Neurological:     Mental Status: He is alert and oriented to person, place, and time.     Coordination: Coordination normal.  Psychiatric:        Behavior: Behavior normal.     Results for orders placed or performed in visit on 09/18/18  CBC with Differential/Platelet  Result Value Ref Range   WBC 7.7 3.4 - 10.8 x10E3/uL   RBC 5.45 4.14 - 5.80 x10E6/uL   Hemoglobin 16.9 13.0 - 17.7 g/dL   Hematocrit 50.7 37.5 - 51.0 %   MCV 93 79 - 97 fL   MCH 31.0 26.6 - 33.0 pg   MCHC 33.3 31.5 - 35.7 g/dL   RDW 12.4 11.6 - 15.4 %   Platelets 220 150 - 450 x10E3/uL   Neutrophils 49  Not Estab. %   Lymphs 39 Not Estab. %   Monocytes 7 Not Estab. %   Eos 4 Not Estab. %   Basos 1 Not Estab. %   Neutrophils Absolute 3.7 1.4 - 7.0 x10E3/uL   Lymphocytes Absolute 3.0 0.7 - 3.1 x10E3/uL   Monocytes Absolute 0.6 0.1 - 0.9 x10E3/uL   EOS (ABSOLUTE) 0.3 0.0 - 0.4 x10E3/uL   Basophils Absolute 0.1 0.0 - 0.2 x10E3/uL   Immature Granulocytes 0 Not Estab. %  Immature Grans (Abs) 0.0 0.0 - 0.1 x10E3/uL  CMP14+EGFR  Result Value Ref Range   Glucose 342 (H) 65 - 99 mg/dL   BUN 13 6 - 24 mg/dL   Creatinine, Ser 0.96 0.76 - 1.27 mg/dL   GFR calc non Af Amer 88 >59 mL/min/1.73   GFR calc Af Amer 102 >59 mL/min/1.73   BUN/Creatinine Ratio 14 9 - 20   Sodium 140 134 - 144 mmol/L   Potassium 4.5 3.5 - 5.2 mmol/L   Chloride 97 96 - 106 mmol/L   CO2 23 20 - 29 mmol/L   Calcium 9.6 8.7 - 10.2 mg/dL   Total Protein 7.0 6.0 - 8.5 g/dL   Albumin 4.4 3.8 - 4.9 g/dL   Globulin, Total 2.6 1.5 - 4.5 g/dL   Albumin/Globulin Ratio 1.7 1.2 - 2.2   Bilirubin Total 0.3 0.0 - 1.2 mg/dL   Alkaline Phosphatase 94 39 - 117 IU/L   AST 24 0 - 40 IU/L   ALT 37 0 - 44 IU/L  Lipid panel  Result Value Ref Range   Cholesterol, Total 199 100 - 199 mg/dL   Triglycerides 283 (H) 0 - 149 mg/dL   HDL 36 (L) >39 mg/dL   VLDL Cholesterol Cal 57 (H) 5 - 40 mg/dL   LDL Calculated 106 (H) 0 - 99 mg/dL   Chol/HDL Ratio 5.5 (H) 0.0 - 5.0 ratio  Bayer DCA Hb A1c Waived  Result Value Ref Range   HB A1C (BAYER DCA - WAIVED) 11.9 (H) <7.0 %      Assessment & Plan:   Problem List Items Addressed This Visit      Cardiovascular and Mediastinum   Hypertension associated with diabetes (HCC)   Relevant Medications   pravastatin (PRAVACHOL) 20 MG tablet   metoprolol succinate (TOPROL-XL) 25 MG 24 hr tablet   Dulaglutide (TRULICITY) 3.38 VA/9.1BT SOPN     Digestive   GERD (gastroesophageal reflux disease)   Relevant Medications   famotidine (PEPCID) 20 MG tablet   pantoprazole (PROTONIX) 40 MG tablet    Other Relevant Orders   CBC with Differential/Platelet (Completed)     Endocrine   Diabetes mellitus type 2 in obese (HCC) - Primary   Relevant Medications   pravastatin (PRAVACHOL) 20 MG tablet   Dulaglutide (TRULICITY) 6.60 AY/0.4HT SOPN   Other Relevant Orders   CMP14+EGFR (Completed)   Bayer DCA Hb A1c Waived (Completed)   Hyperlipidemia associated with type 2 diabetes mellitus (HCC)   Relevant Medications   pravastatin (PRAVACHOL) 20 MG tablet   Dulaglutide (TRULICITY) 9.77 SF/4.2LT SOPN   Other Relevant Orders   Lipid panel (Completed)    Other Visit Diagnoses    Periumbilical abdominal pain       Relevant Orders   CT Abdomen Pelvis W Contrast   Family history of pancreatic cancer       Patient has 4 immediate family members including dad 2 uncles and cousin that have died of pancreatic cancer   Relevant Orders   CT Abdomen Pelvis W Contrast      Patient had been off metformin because of abdominal discomfort and previous kidney issues but his kidney function has been normal in the last couple times and he denies too many issues with the metformin currently, we will return to the Trulicity because he is like that the most but we may also add the metformin in the future if needed.  Patient has central abdominal pain with some pain going into his back that  is not severe today and he describes it as a sharp pain but he has had 4 family members that have pancreatitis and is very concerned about that. Follow up plan: Return in about 3 months (around 12/18/2018), or if symptoms worsen or fail to improve, for Recheck diabetes.  Counseling provided for all of the vaccine components Orders Placed This Encounter  Procedures  . CT Abdomen Pelvis W Contrast  . CBC with Differential/Platelet  . CMP14+EGFR  . Lipid panel  . Bayer [facility] Hb A1c [place], MD [facility] 09/22/2018, 10:03 PM

## 2018-09-21 ENCOUNTER — Telehealth: Payer: Self-pay | Admitting: Family Medicine

## 2018-09-21 NOTE — Telephone Encounter (Signed)
Patient wife aware and verbalized understanding. 

## 2018-09-21 NOTE — Telephone Encounter (Signed)
Patient 's wife aware and verbalized understanding. °

## 2018-09-21 NOTE — Telephone Encounter (Signed)
PT wife is rtn call about lab results, missed call earlier

## 2018-09-22 ENCOUNTER — Telehealth: Payer: Self-pay

## 2018-09-22 NOTE — Telephone Encounter (Signed)
Got a prior auth for Bydureon pen which was approved today through Medicaid but then today got on on Trulicity which is non preferred with Medicaid   Please advise?

## 2018-09-23 NOTE — Telephone Encounter (Signed)
Either Trulicity or Bydureon works the same, if he has the Trulicity then go ahead and keep taking the Trulicity once a week but if it is not covered or he finishes the Trulicity then just go ahead and start the Bydureon once the Trulicity is gone, also weekly

## 2018-09-30 ENCOUNTER — Telehealth: Payer: Self-pay | Admitting: Family Medicine

## 2018-09-30 DIAGNOSIS — E1169 Type 2 diabetes mellitus with other specified complication: Secondary | ICD-10-CM

## 2018-09-30 DIAGNOSIS — E669 Obesity, unspecified: Secondary | ICD-10-CM

## 2018-09-30 MED ORDER — CANAGLIFLOZIN 300 MG PO TABS: 300.0000 mg | ORAL_TABLET | Freq: Every day | ORAL | 1 refills | Status: DC

## 2018-09-30 MED ORDER — METFORMIN HCL ER 500 MG PO TB24: 500.0000 mg | ORAL_TABLET | Freq: Two times a day (BID) | ORAL | 1 refills | Status: DC

## 2018-09-30 NOTE — Telephone Encounter (Signed)
They were on his previous medication list that he just got dropped off, I sent the medication in for him.

## 2018-09-30 NOTE — Telephone Encounter (Signed)
Yes go ahead and send a 90-day supply for both the metformin and Invokana

## 2018-09-30 NOTE — Telephone Encounter (Signed)
Neither of these medications are on his med list.  Please advise.

## 2018-09-30 NOTE — Telephone Encounter (Signed)
Please advise on refills Pt changed to Ismay 300 mg QD & Metformin XR 500 mg BID Not on current med list

## 2018-10-05 DIAGNOSIS — N200 Calculus of kidney: Secondary | ICD-10-CM | POA: Diagnosis not present

## 2018-10-05 DIAGNOSIS — R1033 Periumbilical pain: Secondary | ICD-10-CM | POA: Diagnosis not present

## 2018-10-05 DIAGNOSIS — E1165 Type 2 diabetes mellitus with hyperglycemia: Secondary | ICD-10-CM | POA: Diagnosis not present

## 2018-10-05 DIAGNOSIS — K760 Fatty (change of) liver, not elsewhere classified: Secondary | ICD-10-CM | POA: Diagnosis not present

## 2018-11-17 ENCOUNTER — Telehealth: Payer: Self-pay | Admitting: Family Medicine

## 2018-11-17 NOTE — Telephone Encounter (Signed)
Ok, this is fine. But if symptoms worsen he needs to go to ED.

## 2018-11-17 NOTE — Telephone Encounter (Signed)
Aware. 

## 2018-11-17 NOTE — Telephone Encounter (Signed)
What symptoms do you have? Severe stomach pain, diarrhea he has been able to eat a little bit the last couple of days he went to ER because of stomach pain had a cat scan there was a muscle tear that could develop into a hernia   How long have you been sick? A few weeks  Have you been seen for this problem? Yes went to [facility]  If your provider decides to give you a prescription, which pharmacy would you like for it to be sent to? [facility]   Patient informed that this information will be sent to the clinical staff for review and that they should receive a follow up call.

## 2018-11-17 NOTE — Telephone Encounter (Signed)
Pt states he went to ED for stomach pain for months. The CT I see is from 10/05/18 which is the same date that wife has on discharge papers. Pt had vomiting/diarrhea 3 days ago but not now. He says he isn't taking in a lot of fluids and urine is dark yellow. He is able to eat better now but notices the pain is increased in the evening. On the CT from 2/10 the only thing it mentions is stable kidney stone. Please advise.

## 2018-11-17 NOTE — Telephone Encounter (Signed)
Scheduled patient for an appointment with Dr. Warrick Parisian tomorrow at 10:40 am.

## 2018-11-18 ENCOUNTER — Encounter: Payer: Self-pay | Admitting: Family Medicine

## 2018-11-18 ENCOUNTER — Other Ambulatory Visit: Payer: Self-pay

## 2018-11-18 ENCOUNTER — Ambulatory Visit: Payer: Medicaid Other | Admitting: Family Medicine

## 2018-11-18 VITALS — BP 123/80 | HR 45 | Temp 97.5°F | Ht 76.0 in | Wt 294.0 lb

## 2018-11-18 DIAGNOSIS — R197 Diarrhea, unspecified: Secondary | ICD-10-CM | POA: Diagnosis not present

## 2018-11-18 DIAGNOSIS — R1033 Periumbilical pain: Secondary | ICD-10-CM

## 2018-11-18 NOTE — Progress Notes (Signed)
BP 123/80   Pulse (!) 45   Temp (!) 97.5 F (36.4 C) (Oral)   Ht 6\' 4"  (1.93 m)   Wt 294 lb (133.4 kg)   BMI 35.79 kg/m    Subjective:   Patient ID: Matthew Stewart, male    DOB: Dec 12, 1960, 58 y.o.   MRN: [id number]  HPI: Matthew Stewart is a 58 y.o. male presenting on 11/18/2018 for lower abd pain (Patient was seen 1/24 and pain is no better and states that he throws up in the middle of the night and can't eat like normal)   HPI Patient comes in complaining of lower and mid abdominal pain that is been going on off and on over the past couple months.  He says the pain gets better and then it got worse.  He says when he had to switch from the Ozempic to the Trulicity is when he noticed the big difference initially and then he started getting his hard knots in his stomach where he felt like it was not absorbing and she was not having as much nausea at that time.  Then he switched to using it in his leg just past week and it worsened again to where he is having the nausea and indigestion and abdominal pain.  He denies any vomiting or diarrhea.  He thinks it is actually starting to get better now that he has been on the Trulicity for a week and he has realized that it is probably from that switch in his own words but he wanted to get it checked out anyways.  He denies any blood in his stool or any vomiting.  He denies any lightheadedness or dizziness.  He says the abdominal pain is more of a low-grade discomfort.  He says he never had these issues on the Ozempic but the Ozempic was not covered this year through his insurance company.  He denies the pain going anywhere else  Relevant past medical, surgical, family and social history reviewed and updated as indicated. Interim medical history since our last visit reviewed. Allergies and medications reviewed and updated.  Review of Systems  Constitutional: Negative for chills and fever.  Respiratory: Negative for shortness of breath and wheezing.    Cardiovascular: Negative for chest pain and leg swelling.  Gastrointestinal: Positive for abdominal distention, abdominal pain and nausea. Negative for blood in stool, constipation, diarrhea and vomiting.  Musculoskeletal: Negative for back pain and gait problem.  Skin: Negative for rash.  Neurological: Negative for light-headedness.  All other systems reviewed and are negative.   Per HPI unless specifically indicated above   Allergies as of 11/18/2018      Reactions   Methocarbamol Nausea Only   Breo Ellipta [fluticasone Furoate-vilanterol] Rash   Caused rash around mouth   Spiriva Handihaler [tiotropium Bromide Monohydrate] Rash      Medication List       Accurate as of November 18, 2018 11:08 AM. Always use your most recent med list.        albuterol 108 (90 Base) MCG/ACT inhaler Commonly known as:  PROVENTIL HFA;VENTOLIN HFA Inhale 2 puffs into the lungs every 6 (six) hours as needed for wheezing or shortness of breath.   aspirin 81 MG chewable tablet Chew 81 mg by mouth daily.   canagliflozin 300 MG Tabs tablet Commonly known as:  Invokana Take 1 tablet (300 mg total) by mouth daily before breakfast.   Dulaglutide 0.75 MG/0.5ML Sopn Commonly known as:  Trulicity Inject 1.02 mg into  the skin once a week.   famotidine 20 MG tablet Commonly known as:  Pepcid Take 1 tablet (20 mg total) by mouth 2 (two) times daily.   metFORMIN 500 MG 24 hr tablet Commonly known as:  Glucophage XR Take 1 tablet (500 mg total) by mouth 2 (two) times daily.   metoprolol succinate 25 MG 24 hr tablet Commonly known as:  TOPROL-XL Take 1 tablet (25 mg total) by mouth daily.   pantoprazole 40 MG tablet Commonly known as:  Protonix Take 1 tablet (40 mg total) by mouth daily.   pravastatin 20 MG tablet Commonly known as:  PRAVACHOL Take 1 tablet (20 mg total) by mouth daily.        Objective:   BP 123/80   Pulse (!) 45   Temp (!) 97.5 F (36.4 C) (Oral)   Ht 6\' 4"  (1.93  m)   Wt 294 lb (133.4 kg)   BMI 35.79 kg/m   Wt Readings from Last 3 Encounters:  11/18/18 294 lb (133.4 kg)  09/18/18 (!) 307 lb 6.4 oz (139.4 kg)  06/10/18 299 lb 9.6 oz (135.9 kg)    Physical Exam Vitals signs and nursing note reviewed.  Constitutional:      General: He is not in acute distress.    Appearance: He is well-developed. He is not diaphoretic.  Eyes:     General: No scleral icterus.    Conjunctiva/sclera: Conjunctivae normal.  Neck:     Musculoskeletal: Neck supple.     Thyroid: No thyromegaly.  Cardiovascular:     Rate and Rhythm: Normal rate and regular rhythm.     Heart sounds: Normal heart sounds. No murmur.  Pulmonary:     Effort: Pulmonary effort is normal. No respiratory distress.     Breath sounds: Normal breath sounds. No wheezing.  Abdominal:     General: Abdomen is flat. Bowel sounds are normal. There is no distension.     Tenderness: There is no abdominal tenderness. There is no right CVA tenderness, left CVA tenderness, guarding or rebound.  Musculoskeletal: Normal range of motion.  Lymphadenopathy:     Cervical: No cervical adenopathy.  Skin:    General: Skin is warm and dry.     Findings: No rash.  Neurological:     Mental Status: He is alert and oriented to person, place, and time.     Coordination: Coordination normal.  Psychiatric:        Behavior: Behavior normal.       Assessment & Plan:   Problem List Items Addressed This Visit    None    Visit Diagnoses    Periumbilical abdominal pain    -  Primary   Diarrhea, unspecified type          Presumed because of the Trulicity because he was sticking in his abdomen and it was not absorbing and now it is because he switched to his leg, we will see how the next couple weeks goes but he said he did do a lot better on Ozempic so we may find his insurance company for that in the future if needed.  His sugars are in the 150s now instead of the 400s since he switched it to his leg.  Patient  does have hard nodules on his abdomen where it feels like it is not absorbing.  Patient had CT scan at the emergency department recently for this and it came back normal except for a renal stone that was still in the  renal pelvis. Follow up plan: Return if symptoms worsen or fail to improve.  Counseling provided for all of the vaccine components No orders of the defined types were placed in this encounter.   [name], MD [facility] 11/18/2018, 11:08 AM

## 2018-11-19 ENCOUNTER — Other Ambulatory Visit: Payer: Self-pay | Admitting: Family Medicine

## 2018-11-19 MED ORDER — ONDANSETRON 4 MG PO TBDP: 4.0000 mg | ORAL_TABLET | Freq: Three times a day (TID) | ORAL | 0 refills | Status: DC | PRN

## 2018-11-19 NOTE — Progress Notes (Signed)
Sent in Zofran for the patient for nausea

## 2018-11-30 ENCOUNTER — Telehealth: Payer: Self-pay | Admitting: Family Medicine

## 2018-11-30 MED ORDER — SEMAGLUTIDE(0.25 OR 0.5MG/DOS) 2 MG/1.5ML ~~LOC~~ SOPN: 0.5000 mg | PEN_INJECTOR | SUBCUTANEOUS | 3 refills | Status: DC

## 2018-11-30 NOTE — Telephone Encounter (Signed)
Please let the patient know that I have sent in Matthew Stewart again because he tolerated the Ozempic better than any of the others without the side effects and has not been able to tolerate the others, we had to switch because of insurance coverage but I think we should go ahead and do a prior authorization based on tolerability and symptoms.  He has been having continued nausea vomiting and diarrhea and abdominal complaints with both Trulicity and Bydureon.  He did not have these while taking Ozempic [name], MD [facility] 11/30/2018, 11:22 AM

## 2018-11-30 NOTE — Telephone Encounter (Signed)
Patient's wife aware per dpr.

## 2018-11-30 NOTE — Telephone Encounter (Signed)
PT wife has called wanting Dr Dettinger to know that he took last dose bydureon and wife states that this medication makes him throw up really bad.

## 2018-12-09 DIAGNOSIS — R002 Palpitations: Secondary | ICD-10-CM | POA: Diagnosis not present

## 2019-01-08 ENCOUNTER — Telehealth: Payer: Self-pay | Admitting: Family Medicine

## 2019-01-20 ENCOUNTER — Ambulatory Visit (INDEPENDENT_AMBULATORY_CARE_PROVIDER_SITE_OTHER): Payer: Medicaid Other | Admitting: Family Medicine

## 2019-01-20 ENCOUNTER — Encounter: Payer: Self-pay | Admitting: Family Medicine

## 2019-01-20 ENCOUNTER — Other Ambulatory Visit: Payer: Self-pay

## 2019-01-20 DIAGNOSIS — E1169 Type 2 diabetes mellitus with other specified complication: Secondary | ICD-10-CM | POA: Diagnosis not present

## 2019-01-20 DIAGNOSIS — E669 Obesity, unspecified: Secondary | ICD-10-CM | POA: Diagnosis not present

## 2019-01-20 DIAGNOSIS — K219 Gastro-esophageal reflux disease without esophagitis: Secondary | ICD-10-CM

## 2019-01-20 DIAGNOSIS — J449 Chronic obstructive pulmonary disease, unspecified: Secondary | ICD-10-CM

## 2019-01-20 DIAGNOSIS — E1159 Type 2 diabetes mellitus with other circulatory complications: Secondary | ICD-10-CM | POA: Diagnosis not present

## 2019-01-20 DIAGNOSIS — I152 Hypertension secondary to endocrine disorders: Secondary | ICD-10-CM

## 2019-01-20 DIAGNOSIS — I10 Essential (primary) hypertension: Secondary | ICD-10-CM | POA: Diagnosis not present

## 2019-01-20 DIAGNOSIS — J4489 Other specified chronic obstructive pulmonary disease: Secondary | ICD-10-CM

## 2019-01-20 DIAGNOSIS — E785 Hyperlipidemia, unspecified: Secondary | ICD-10-CM | POA: Diagnosis not present

## 2019-01-20 NOTE — Progress Notes (Signed)
Virtual Visit via telephone Note  I connected with Matthew Stewart on 01/20/19 at 1307 by telephone and verified that I am speaking with the correct person using two identifiers. Matthew Stewart is currently located at home and no other people are currently with her during visit. The provider, Fransisca Kaufmann Cassia Fein, MD is located in their office at time of visit.  Call ended at 1317  I discussed the limitations, risks, security and privacy concerns of performing an evaluation and management service by telephone and the availability of in person appointments. I also discussed with the patient that there may be a patient responsible charge related to this service. The patient expressed understanding and agreed to proceed.   History and Present Illness: Type 2 diabetes mellitus Patient comes in today for recheck of his diabetes. Patient has been currently taking ozempic and metformin and invokana. Patient is not currently on an ACE inhibitor/ARB. Patient has not seen an ophthalmologist this year. Patient denies any issues with their feet.   Hypertension Patient is currently on metoprolol, and their blood pressure today is 646'O systolic bloodpressure. Patient denies any lightheadedness or dizziness. Patient denies headaches, blurred vision, chest pains, shortness of breath, or weakness. Denies any side effects from medication and is content with current medication.   Hyperlipidemia Patient is coming in for recheck of his hyperlipidemia. The patient is currently taking pravastatin. They deny any issues with myalgias or history of liver damage from it. They deny any focal numbness or weakness or chest pain.   GERD Patient is currently on famotidine and pantaprazole.  She denies any major symptoms or abdominal pain or belching or burping. She denies any blood in her stool or lightheadedness or dizziness.   No diagnosis found.  Outpatient Encounter Medications as of 01/20/2019  Medication Sig  .  albuterol (PROVENTIL HFA;VENTOLIN HFA) 108 (90 Base) MCG/ACT inhaler Inhale 2 puffs into the lungs every 6 (six) hours as needed for wheezing or shortness of breath.  Marland Kitchen aspirin 81 MG chewable tablet Chew 81 mg by mouth daily.   . canagliflozin (INVOKANA) 300 MG TABS tablet Take 1 tablet (300 mg total) by mouth daily before breakfast.  . Dulaglutide (TRULICITY) 0.32 ZY/2.4MG SOPN Inject 0.75 mg into the skin once a week.  . famotidine (PEPCID) 20 MG tablet Take 1 tablet (20 mg total) by mouth 2 (two) times daily.  . metFORMIN (GLUCOPHAGE XR) 500 MG 24 hr tablet Take 1 tablet (500 mg total) by mouth 2 (two) times daily.  . metoprolol succinate (TOPROL-XL) 25 MG 24 hr tablet Take 1 tablet (25 mg total) by mouth daily.  . ondansetron (ZOFRAN ODT) 4 MG disintegrating tablet Take 1 tablet (4 mg total) by mouth every 8 (eight) hours as needed for nausea or vomiting.  . pantoprazole (PROTONIX) 40 MG tablet Take 1 tablet (40 mg total) by mouth daily.  . pravastatin (PRAVACHOL) 20 MG tablet Take 1 tablet (20 mg total) by mouth daily.  . Semaglutide,0.25 or 0.5MG /DOS, (OZEMPIC, 0.25 OR 0.5 MG/DOSE,) 2 MG/1.5ML SOPN Inject 0.5 mg into the skin once a week.   No facility-administered encounter medications on file as of 01/20/2019.     Review of Systems  Constitutional: Negative for chills and fever.  Eyes: Negative for visual disturbance.  Respiratory: Negative for shortness of breath and wheezing.   Cardiovascular: Negative for chest pain and leg swelling.  Musculoskeletal: Negative for back pain and gait problem.  Skin: Negative for rash.  Neurological: Positive for numbness. Negative for  dizziness, weakness and light-headedness.  All other systems reviewed and are negative.   Observations/Objective:  patient sounds comfortable and in no acute distress  Assessment and Plan: Problem List Items Addressed This Visit      Cardiovascular and Mediastinum   Hypertension associated with diabetes ([name])      Respiratory   COPD (chronic obstructive pulmonary disease) with chronic bronchitis (HCC)     Digestive   GERD (gastroesophageal reflux disease)     Endocrine   Diabetes mellitus type 2 in obese ([name]) - Primary   Hyperlipidemia associated with type 2 diabetes mellitus ([name])       Follow Up Instructions: Continue current medications and no medications  Follow up in 3 months   I discussed the assessment and treatment plan with the patient. The patient was provided an opportunity to ask questions and all were answered. The patient agreed with the plan and demonstrated an understanding of the instructions.   The patient was advised to call back or seek an in-person evaluation if the symptoms worsen or if the condition fails to improve as anticipated.  The above assessment and management plan was discussed with the patient. The patient verbalized understanding of and has agreed to the management plan. Patient is aware to call the clinic if symptoms persist or worsen. Patient is aware when to return to the clinic for a follow-up visit. Patient educated on when it is appropriate to go to the emergency department.    I provided 10 minutes of non-face-to-face time during this encounter.    Worthy Rancher, MD

## 2019-03-09 DIAGNOSIS — R002 Palpitations: Secondary | ICD-10-CM | POA: Diagnosis not present

## 2019-04-30 DIAGNOSIS — M4716 Other spondylosis with myelopathy, lumbar region: Secondary | ICD-10-CM | POA: Diagnosis not present

## 2019-04-30 DIAGNOSIS — M545 Low back pain: Secondary | ICD-10-CM | POA: Diagnosis not present

## 2019-06-07 DIAGNOSIS — R002 Palpitations: Secondary | ICD-10-CM | POA: Diagnosis not present

## 2019-06-09 ENCOUNTER — Ambulatory Visit (INDEPENDENT_AMBULATORY_CARE_PROVIDER_SITE_OTHER): Payer: Medicaid Other | Admitting: Family Medicine

## 2019-06-09 ENCOUNTER — Encounter: Payer: Self-pay | Admitting: Family Medicine

## 2019-06-09 ENCOUNTER — Other Ambulatory Visit: Payer: Self-pay

## 2019-06-09 DIAGNOSIS — E669 Obesity, unspecified: Secondary | ICD-10-CM

## 2019-06-09 DIAGNOSIS — E1169 Type 2 diabetes mellitus with other specified complication: Secondary | ICD-10-CM

## 2019-06-09 DIAGNOSIS — K2100 Gastro-esophageal reflux disease with esophagitis, without bleeding: Secondary | ICD-10-CM | POA: Diagnosis not present

## 2019-06-09 DIAGNOSIS — E785 Hyperlipidemia, unspecified: Secondary | ICD-10-CM | POA: Diagnosis not present

## 2019-06-09 NOTE — Progress Notes (Signed)
Virtual Visit via telephone Note  I connected with Matthew Stewart on 06/09/19 at 1150 by telephone and verified that I am speaking with the correct person using two identifiers. Matthew Stewart is currently located at home and no other peopel are currently with her during visit. The provider, Fransisca Kaufmann Kaysha Parsell, MD is located in their office at time of visit.  Call ended at 1210  I discussed the limitations, risks, security and privacy concerns of performing an evaluation and management service by telephone and the availability of in person appointments. I also discussed with the patient that there may be a patient responsible charge related to this service. The patient expressed understanding and agreed to proceed.   History and Present Illness: Type 2 diabetes mellitus Patient comes in today for recheck of his diabetes. Patient has been currently taking invokana and metformin and ozempic and bs 98-104. Patient is not currently on an ACE inhibitor/ARB. Patient has not seen an ophthalmologist this year. Patient denies any issues with their feet.   Hypertension Patient is currently on metoprolol, and their blood pressure today is unknown because he has not taken it. Patient denies any lightheadedness or dizziness. Patient denies headaches, blurred vision, chest pains, shortness of breath, or weakness. Denies any side effects from medication and is content with current medication.   Hyperlipidemia Patient is coming in for recheck of his hyperlipidemia. The patient is currently taking pravastatin. They deny any issues with myalgias or history of liver damage from it. They deny any focal numbness or weakness or chest pain.   Patient is still having issues with eating because of gas and not passing food in his gut. He has pain in upper epigastric region of abdomen.  He also has diarrhea.  He feels like food is not processing, this was happening for 6 months and before ozempic was started  No  diagnosis found.  Outpatient Encounter Medications as of 06/09/2019  Medication Sig  . albuterol (PROVENTIL HFA;VENTOLIN HFA) 108 (90 Base) MCG/ACT inhaler Inhale 2 puffs into the lungs every 6 (six) hours as needed for wheezing or shortness of breath.  Marland Kitchen aspirin 81 MG chewable tablet Chew 81 mg by mouth daily.   . canagliflozin (INVOKANA) 300 MG TABS tablet Take 1 tablet (300 mg total) by mouth daily before breakfast.  . famotidine (PEPCID) 20 MG tablet Take 1 tablet (20 mg total) by mouth 2 (two) times daily.  . metFORMIN (GLUCOPHAGE XR) 500 MG 24 hr tablet Take 1 tablet (500 mg total) by mouth 2 (two) times daily.  . metoprolol succinate (TOPROL-XL) 25 MG 24 hr tablet Take 1 tablet (25 mg total) by mouth daily.  . ondansetron (ZOFRAN ODT) 4 MG disintegrating tablet Take 1 tablet (4 mg total) by mouth every 8 (eight) hours as needed for nausea or vomiting.  . pantoprazole (PROTONIX) 40 MG tablet Take 1 tablet (40 mg total) by mouth daily.  . pravastatin (PRAVACHOL) 20 MG tablet Take 1 tablet (20 mg total) by mouth daily.  . Semaglutide,0.25 or 0.5MG/DOS, (OZEMPIC, 0.25 OR 0.5 MG/DOSE,) 2 MG/1.5ML SOPN Inject 0.5 mg into the skin once a week.   No facility-administered encounter medications on file as of 06/09/2019.     Review of Systems  Constitutional: Negative for chills and fever.  Respiratory: Negative for shortness of breath and wheezing.   Cardiovascular: Negative for chest pain and leg swelling.  Gastrointestinal: Positive for abdominal distention, abdominal pain, diarrhea and nausea. Negative for constipation and vomiting.  Musculoskeletal: Negative  for back pain and gait problem.  Skin: Negative for rash.  Neurological: Negative for dizziness, weakness and light-headedness.  All other systems reviewed and are negative.   Observations/Objective:  patient sounds comfortable and in no acute distress  Assessment and Plan: Problem List Items Addressed This Visit       Digestive   GERD (gastroesophageal reflux disease)   Relevant Orders   CBC with Differential/Platelet   CMP14+EGFR   Lipid panel   Bayer DCA Hb A1c Waived     Endocrine   Diabetes mellitus type 2 in obese [facility]) - Primary   Relevant Orders   Ambulatory referral to Gastroenterology   CBC with Differential/Platelet   CMP14+EGFR   Lipid panel   Bayer DCA Hb A1c Waived   Hyperlipidemia associated with type 2 diabetes mellitus ([place])   Relevant Orders   CBC with Differential/Platelet   CMP14+EGFR   Lipid panel   Bayer DCA Hb A1c Waived      May have gastroparesis but will d/w GI  Follow Up Instructions:  Follow up in 3 months   Continue with Ozempic for now and will see gastroenterology but there is a slight chance he could have gastroparesis although the symptoms started prior to the [place] and have not gotten worse since the Ozempic but have not gotten better.   I discussed the assessment and treatment plan with the patient. The patient was provided an opportunity to ask questions and all were answered. The patient agreed with the plan and demonstrated an understanding of the instructions.   The patient was advised to call back or seek an in-person evaluation if the symptoms worsen or if the condition fails to improve as anticipated.  The above assessment and management plan was discussed with the patient. The patient verbalized understanding of and has agreed to the management plan. Patient is aware to call the clinic if symptoms persist or worsen. Patient is aware when to return to the clinic for a follow-up visit. Patient educated on when it is appropriate to go to the emergency department.    I provided 20 minutes of non-face-to-face time during this encounter.    Worthy Rancher, MD

## 2019-06-10 ENCOUNTER — Encounter: Payer: Self-pay | Admitting: Internal Medicine

## 2019-06-15 DIAGNOSIS — H40033 Anatomical narrow angle, bilateral: Secondary | ICD-10-CM | POA: Diagnosis not present

## 2019-06-15 DIAGNOSIS — H16223 Keratoconjunctivitis sicca, not specified as Sjogren's, bilateral: Secondary | ICD-10-CM | POA: Diagnosis not present

## 2019-06-16 NOTE — Addendum Note (Signed)
Addended by: Liliane Bade on: 06/16/2019 11:52 AM   Modules accepted: Orders

## 2019-06-17 ENCOUNTER — Other Ambulatory Visit: Payer: Self-pay | Admitting: Family Medicine

## 2019-06-17 DIAGNOSIS — E785 Hyperlipidemia, unspecified: Secondary | ICD-10-CM | POA: Diagnosis not present

## 2019-06-17 DIAGNOSIS — E1169 Type 2 diabetes mellitus with other specified complication: Secondary | ICD-10-CM | POA: Diagnosis not present

## 2019-06-17 DIAGNOSIS — E669 Obesity, unspecified: Secondary | ICD-10-CM | POA: Diagnosis not present

## 2019-06-17 DIAGNOSIS — K2100 Gastro-esophageal reflux disease with esophagitis, without bleeding: Secondary | ICD-10-CM | POA: Diagnosis not present

## 2019-06-18 LAB — CBC WITH DIFFERENTIAL/PLATELET
Basophils Absolute: 0.1 10*3/uL (ref 0.0–0.2)
Basos: 1 %
EOS (ABSOLUTE): 0.6 10*3/uL — ABNORMAL HIGH (ref 0.0–0.4)
Eos: 8 %
Hematocrit: 47.8 % (ref 37.5–51.0)
Hemoglobin: 16.1 g/dL (ref 13.0–17.7)
Immature Grans (Abs): 0 10*3/uL (ref 0.0–0.1)
Immature Granulocytes: 0 %
Lymphocytes Absolute: 2.8 10*3/uL (ref 0.7–3.1)
Lymphs: 38 %
MCH: 31.1 pg (ref 26.6–33.0)
MCHC: 33.7 g/dL (ref 31.5–35.7)
MCV: 93 fL (ref 79–97)
Monocytes Absolute: 0.5 10*3/uL (ref 0.1–0.9)
Monocytes: 7 %
Neutrophils Absolute: 3.3 10*3/uL (ref 1.4–7.0)
Neutrophils: 46 %
Platelets: 268 10*3/uL (ref 150–450)
RBC: 5.17 x10E6/uL (ref 4.14–5.80)
RDW: 12.2 % (ref 11.6–15.4)
WBC: 7.3 10*3/uL (ref 3.4–10.8)

## 2019-06-18 LAB — CMP14+EGFR
ALT: 38 IU/L (ref 0–44)
AST: 25 IU/L (ref 0–40)
Albumin/Globulin Ratio: 2 (ref 1.2–2.2)
Albumin: 4.3 g/dL (ref 3.8–4.9)
Alkaline Phosphatase: 78 IU/L (ref 39–117)
BUN/Creatinine Ratio: 8 — ABNORMAL LOW (ref 9–20)
BUN: 7 mg/dL (ref 6–24)
Bilirubin Total: 0.3 mg/dL (ref 0.0–1.2)
CO2: 23 mmol/L (ref 20–29)
Calcium: 9.2 mg/dL (ref 8.7–10.2)
Chloride: 104 mmol/L (ref 96–106)
Creatinine, Ser: 0.84 mg/dL (ref 0.76–1.27)
GFR calc Af Amer: 112 mL/min/{1.73_m2} (ref >59)
GFR calc non Af Amer: 97 mL/min/{1.73_m2} (ref >59)
Globulin, Total: 2.2 g/dL (ref 1.5–4.5)
Glucose: 186 mg/dL — ABNORMAL HIGH (ref 65–99)
Potassium: 5 mmol/L (ref 3.5–5.2)
Sodium: 138 mmol/L (ref 134–144)
Total Protein: 6.5 g/dL (ref 6.0–8.5)

## 2019-06-18 LAB — LIPID PANEL
Chol/HDL Ratio: 3.5 ratio (ref 0.0–5.0)
Cholesterol, Total: 159 mg/dL (ref 100–199)
HDL: 46 mg/dL (ref >39)
LDL Chol Calc (NIH): 92 mg/dL (ref 0–99)
Triglycerides: 117 mg/dL (ref 0–149)
VLDL Cholesterol Cal: 21 mg/dL (ref 5–40)

## 2019-06-29 ENCOUNTER — Telehealth: Payer: Self-pay | Admitting: Family Medicine

## 2019-06-29 DIAGNOSIS — M5416 Radiculopathy, lumbar region: Secondary | ICD-10-CM

## 2019-06-29 NOTE — Telephone Encounter (Signed)
He is an orthopedic surgeon for M54.16 (ICD-10-CM) - Lumbar radiculopathy

## 2019-06-29 NOTE — Telephone Encounter (Signed)
I do not know what type of specialist Dr. Lurene Shadow is?

## 2019-06-30 ENCOUNTER — Encounter: Payer: Self-pay | Admitting: *Deleted

## 2019-06-30 ENCOUNTER — Other Ambulatory Visit: Payer: Self-pay

## 2019-06-30 ENCOUNTER — Other Ambulatory Visit: Payer: Self-pay | Admitting: *Deleted

## 2019-06-30 ENCOUNTER — Ambulatory Visit (INDEPENDENT_AMBULATORY_CARE_PROVIDER_SITE_OTHER): Payer: Medicaid Other | Admitting: Gastroenterology

## 2019-06-30 ENCOUNTER — Encounter: Payer: Self-pay | Admitting: Gastroenterology

## 2019-06-30 VITALS — BP 133/89 | HR 86 | Temp 96.8°F | Ht 76.0 in | Wt 287.0 lb

## 2019-06-30 DIAGNOSIS — R6881 Early satiety: Secondary | ICD-10-CM

## 2019-06-30 DIAGNOSIS — R198 Other specified symptoms and signs involving the digestive system and abdomen: Secondary | ICD-10-CM | POA: Insufficient documentation

## 2019-06-30 DIAGNOSIS — K219 Gastro-esophageal reflux disease without esophagitis: Secondary | ICD-10-CM | POA: Diagnosis not present

## 2019-06-30 DIAGNOSIS — R1013 Epigastric pain: Secondary | ICD-10-CM
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
## 2019-06-30 NOTE — Patient Instructions (Signed)
1. Resume pantoprazole 40 mg, 30 minutes before breakfast. 2. We will plan for upper endoscopy as scheduled.  Please see separate instructions. 3. We will obtain a copy of your previous colonoscopy report to give you further instructions regarding timing of next colonoscopy. 4. Control your diabetes is much as possible.  Poorly controlled diabetes will affect the digestive system.  I suspect you have some gastroparesis.  See handout below. 5. To avoid the sensation of feeling really full, upper abdominal discomfort, please try to limit the amount of red meat you are consuming, eat small portions of food 5-6 times daily, do not overeat.   Gastroparesis  Gastroparesis is a condition in which food takes longer than normal to empty from the stomach. The condition is usually long-lasting (chronic). It may also be called delayed gastric emptying. There is no cure, but there are treatments and things that you can do at home to help relieve symptoms. Treating the underlying condition that causes gastroparesis can also help relieve symptoms. What are the causes? In many cases, the cause of this condition is not known. Possible causes include:  A hormone (endocrine) disorder, such as hypothyroidism or diabetes.  A nervous system disease, such as Parkinson's disease or multiple sclerosis.  Cancer, infection, or surgery that affects the stomach or vagus nerve. The vagus nerve runs from your chest, through your neck, to the lower part of your brain.  A connective tissue disorder, such as scleroderma.  Certain medicines. What increases the risk? You are more likely to develop this condition if you:  Have certain disorders or diseases, including: ? An endocrine disorder. ? An eating disorder. ? Amyloidosis. ? Scleroderma. ? Parkinson's disease. ? Multiple sclerosis. ? Cancer or infection of the stomach or the vagus nerve.  Have had surgery on the stomach or vagus nerve.  Take certain medicines.   Are male. What are the signs or symptoms? Symptoms of this condition include:  Feeling full after eating very little.  Nausea.  Vomiting.  Heartburn.  Abdominal bloating.  Inconsistent blood sugar (glucose) levels on blood tests.  Lack of appetite.  Weight loss.  Acid from the stomach coming up into the esophagus (gastroesophageal reflux).  Sudden tightening (spasm) of the stomach, which can be painful. Symptoms may come and go. Some people may not notice any symptoms. How is this diagnosed? This condition is diagnosed with tests, such as:  Tests that check how long it takes food to move through the stomach and intestines. These tests include: ? Upper gastrointestinal (GI) series. For this test, you drink a liquid that shows up well on X-rays, and then X-rays will be taken of your intestines. ? Gastric emptying scintigraphy. For this test, you eat food that contains a small amount of radioactive material, and then scans are taken. ? Wireless capsule GI monitoring system. For this test, you swallow a pill (capsule) that records information about how foods and fluid move through your stomach.  Gastric manometry. For this test, a tube is passed down your throat and into your stomach to measure electrical and muscular activity.  Endoscopy. For this test, a long, thin tube is passed down your throat and into your stomach to check for problems in your stomach lining.  Ultrasound. This test uses sound waves to create images of inside the body. This can help rule out gallbladder disease or pancreatitis as a cause of your symptoms. How is this treated? There is no cure for gastroparesis. Treatment may include:  Treating the  underlying cause.  Managing your symptoms by making changes to your diet and exercise habits.  Taking medicines to control nausea and vomiting and to stimulate stomach muscles.  Getting food through a feeding tube in the hospital. This may be done in  severe cases.  Having surgery to insert a device into your body that helps improve stomach emptying and control nausea and vomiting (gastric neurostimulator). Follow these instructions at home:  Take over-the-counter and prescription medicines only as told by your health care provider.  Follow instructions from your health care provider about eating or drinking restrictions. Your health care provider may recommend that you: ? Eat smaller meals more often. ? Eat low-fat foods. ? Eat low-fiber forms of high-fiber foods. For example, eat cooked vegetables instead of raw vegetables. ? Have only liquid foods instead of solid foods. Liquid foods are easier to digest.  Drink enough fluid to keep your urine pale yellow.  Exercise as often as told by your health care provider.  Keep all follow-up visits as told by your health care provider. This is important. Contact a health care provider if you:  Notice that your symptoms do not improve with treatment.  Have new symptoms. Get help right away if you:  Have severe abdominal pain that does not improve with treatment.  Have nausea that is severe or does not go away.  Cannot drink fluids without vomiting. Summary  Gastroparesis is a chronic condition in which food takes longer than normal to empty from the stomach.  Symptoms include nausea, vomiting, heartburn, abdominal bloating, and loss of appetite.  Eating smaller portions, and low-fat, low-fiber foods may help you manage your symptoms.  Get help right away if you have severe abdominal pain. This information is not intended to replace advice given to you by your health care provider. Make sure you discuss any questions you have with your health care provider. Document Released: 08/12/2005 Document Revised: 11/10/2017 Document Reviewed: 06/17/2017 Elsevier Patient Education  2020 [employer].

## 2019-06-30 NOTE — Telephone Encounter (Signed)
I have placed the referral for the patient

## 2019-07-02 ENCOUNTER — Telehealth: Payer: Self-pay | Admitting: Gastroenterology

## 2019-07-02 ENCOUNTER — Encounter: Payer: Self-pay | Admitting: Gastroenterology

## 2019-07-02 DIAGNOSIS — M545 Low back pain: Secondary | ICD-10-CM | POA: Diagnosis not present

## 2019-07-02 NOTE — Telephone Encounter (Signed)
Colonoscopy report from Dr. Britta Mccreedy dated November 2014  Quality of prep was good.  Internal hemorrhoids noted.  Two polyps removed from the sigmoid colon/rectosigmoid area, tubular adenomas.  Next colonoscopy recommended in November 2019.   PLEASE LET PT KNOW WE REVIEWED HIS COLONOSCOPY RECORDS. HE HAD TWO TUBULAR ADENOMAS REMOVED AND SHOULD HAVE HAD A COLONOSCOPY IN 06/2018 FOR F/U. ONCE WE ADDRESS HIS UGI SYMPTOMS WE WILL WORK TOWARDS A COLONOSCOPY.  PLEASE ARRANGE FOR OV FOR FOLLOW UP IN 09/2019. WE WILL SCHEDULE TCS AT THAT TIME.

## 2019-07-05 ENCOUNTER — Encounter: Payer: Self-pay | Admitting: Internal Medicine

## 2019-07-05 NOTE — Telephone Encounter (Signed)
Noted. Spoke with pt. Pt is aware that [facility] has reviewed his TCS report, results and pt needs to update his TCS at his scheduled apt 09/2019.

## 2019-07-05 NOTE — Telephone Encounter (Signed)
PATIENT SCHEDULED AND LETTER SENT  °

## 2019-07-12 ENCOUNTER — Telehealth: Payer: Self-pay | Admitting: Internal Medicine

## 2019-07-12 NOTE — Telephone Encounter (Signed)
LMOVM for pt 

## 2019-07-12 NOTE — Telephone Encounter (Signed)
PATIENT WIFE CALLED AND SAID PATIENT NEEDED TO HAVE HIS PROCEDURE SOONER BECAUSE "He is dying now"

## 2019-07-12 NOTE — Telephone Encounter (Signed)
Advised spouse RMR has no sooner appt. She thought procedure was on February. I advised that is his f/u. He is scheduled for procedure 12/7. She voiced understanding

## 2019-07-22 DIAGNOSIS — H5213 Myopia, bilateral: Secondary | ICD-10-CM | POA: Diagnosis not present

## 2019-07-27 DIAGNOSIS — M48061 Spinal stenosis, lumbar region without neurogenic claudication: Secondary | ICD-10-CM | POA: Diagnosis not present

## 2019-07-29 ENCOUNTER — Other Ambulatory Visit (HOSPITAL_COMMUNITY)
Admission: RE | Admit: 2019-07-29 | Discharge: 2019-07-29 | Disposition: A | Discharge status: Home / Self Care | Payer: Medicaid Other | Source: Ambulatory Visit | Attending: Internal Medicine | Admitting: Internal Medicine

## 2019-07-29 ENCOUNTER — Other Ambulatory Visit: Payer: Self-pay

## 2019-07-29 ENCOUNTER — Encounter (HOSPITAL_COMMUNITY)
Admission: RE | Admit: 2019-07-29 | Discharge: 2019-07-29 | Disposition: A | Discharge status: Home / Self Care | Payer: Medicaid Other | Source: Ambulatory Visit | Attending: Internal Medicine | Admitting: Internal Medicine

## 2019-07-29 DIAGNOSIS — Z01812 Encounter for preprocedural laboratory examination: Secondary | ICD-10-CM | POA: Insufficient documentation

## 2019-07-29 DIAGNOSIS — Z20828 Contact with and (suspected) exposure to other viral communicable diseases: Secondary | ICD-10-CM | POA: Diagnosis not present

## 2019-07-29 LAB — SARS CORONAVIRUS 2 (TAT 6-24 HRS): SARS Coronavirus 2: NEGATIVE

## 2019-08-02 ENCOUNTER — Ambulatory Visit (HOSPITAL_COMMUNITY): Payer: Medicaid Other | Admitting: Anesthesiology

## 2019-08-02 ENCOUNTER — Ambulatory Visit (HOSPITAL_COMMUNITY)
Admission: RE | Admit: 2019-08-02 | Discharge: 2019-08-02 | Disposition: A | Discharge status: Home / Self Care | Payer: Medicaid Other | Attending: Internal Medicine | Admitting: Internal Medicine

## 2019-08-02 ENCOUNTER — Encounter (HOSPITAL_COMMUNITY): Payer: Self-pay | Admitting: *Deleted

## 2019-08-02 ENCOUNTER — Encounter (HOSPITAL_COMMUNITY)
Admission: RE | Disposition: A | Discharge status: Home / Self Care | Payer: Self-pay | Source: Home / Self Care | Attending: Internal Medicine

## 2019-08-02 DIAGNOSIS — F17210 Nicotine dependence, cigarettes, uncomplicated: Secondary | ICD-10-CM | POA: Insufficient documentation

## 2019-08-02 DIAGNOSIS — M47896 Other spondylosis, lumbar region: Secondary | ICD-10-CM | POA: Diagnosis not present

## 2019-08-02 DIAGNOSIS — R6881 Early satiety: Secondary | ICD-10-CM

## 2019-08-02 DIAGNOSIS — K2100 Gastro-esophageal reflux disease with esophagitis, without bleeding: Secondary | ICD-10-CM | POA: Diagnosis not present

## 2019-08-02 DIAGNOSIS — Z8249 Family history of ischemic heart disease and other diseases of the circulatory system: Secondary | ICD-10-CM | POA: Diagnosis not present

## 2019-08-02 DIAGNOSIS — I10 Essential (primary) hypertension: Secondary | ICD-10-CM | POA: Diagnosis not present

## 2019-08-02 DIAGNOSIS — R1013 Epigastric pain: Secondary | ICD-10-CM | POA: Diagnosis not present

## 2019-08-02 DIAGNOSIS — M47892 Other spondylosis, cervical region: Secondary | ICD-10-CM | POA: Diagnosis not present

## 2019-08-02 DIAGNOSIS — I252 Old myocardial infarction: Secondary | ICD-10-CM | POA: Insufficient documentation

## 2019-08-02 DIAGNOSIS — K209 Esophagitis, unspecified without bleeding: Secondary | ICD-10-CM | POA: Diagnosis not present

## 2019-08-02 DIAGNOSIS — Z8719 Personal history of other diseases of the digestive system: Secondary | ICD-10-CM | POA: Insufficient documentation

## 2019-08-02 DIAGNOSIS — Z981 Arthrodesis status: Secondary | ICD-10-CM | POA: Insufficient documentation

## 2019-08-02 DIAGNOSIS — E1151 Type 2 diabetes mellitus with diabetic peripheral angiopathy without gangrene: Secondary | ICD-10-CM | POA: Insufficient documentation

## 2019-08-02 DIAGNOSIS — J449 Chronic obstructive pulmonary disease, unspecified: Secondary | ICD-10-CM | POA: Insufficient documentation

## 2019-08-02 DIAGNOSIS — B3781 Candidal esophagitis: Secondary | ICD-10-CM | POA: Insufficient documentation

## 2019-08-02 DIAGNOSIS — Z6834 Body mass index (BMI) 34.0-34.9, adult: Secondary | ICD-10-CM | POA: Insufficient documentation

## 2019-08-02 DIAGNOSIS — K219 Gastro-esophageal reflux disease without esophagitis: Secondary | ICD-10-CM | POA: Insufficient documentation

## 2019-08-02 DIAGNOSIS — G709 Myoneural disorder, unspecified: Secondary | ICD-10-CM | POA: Diagnosis not present

## 2019-08-02 DIAGNOSIS — K449 Diaphragmatic hernia without obstruction or gangrene: Secondary | ICD-10-CM | POA: Diagnosis not present

## 2019-08-02 DIAGNOSIS — I2510 Atherosclerotic heart disease of native coronary artery without angina pectoris: Secondary | ICD-10-CM | POA: Insufficient documentation

## 2019-08-02 DIAGNOSIS — K2210 Ulcer of esophagus without bleeding: Secondary | ICD-10-CM | POA: Insufficient documentation

## 2019-08-02 HISTORY — PX: ESOPHAGOGASTRODUODENOSCOPY (EGD) WITH PROPOFOL: SHX5813

## 2019-08-02 HISTORY — PX: ESOPHAGEAL BRUSHING: SHX6842

## 2019-08-02 LAB — KOH PREP

## 2019-08-02 LAB — GLUCOSE, CAPILLARY
Glucose-Capillary: 189 mg/dL — ABNORMAL HIGH (ref 70–99)
Glucose-Capillary: 166 mg/dL — ABNORMAL HIGH (ref 70–99)

## 2019-08-02 SURGERY — ESOPHAGOGASTRODUODENOSCOPY (EGD) WITH PROPOFOL
Anesthesia: General

## 2019-08-02 MED ORDER — LACTATED RINGERS IV SOLN
Freq: Once | INTRAVENOUS | Status: AC
  Administered 2019-08-02: 07:00:00 via INTRAVENOUS

## 2019-08-02 MED ORDER — KETAMINE HCL 10 MG/ML IJ SOLN
INTRAMUSCULAR | Status: DC | PRN
  Administered 2019-08-02: 30 mg via INTRAVENOUS

## 2019-08-02 MED ORDER — PROPOFOL 10 MG/ML IV BOLUS
INTRAVENOUS | Status: DC | PRN
  Administered 2019-08-02: 20 mg via INTRAVENOUS

## 2019-08-02 MED ORDER — CHLORHEXIDINE GLUCONATE CLOTH 2 % EX PADS: 6.0000 | MEDICATED_PAD | Freq: Once | CUTANEOUS | Status: DC

## 2019-08-02 MED ORDER — PROPOFOL 500 MG/50ML IV EMUL
INTRAVENOUS | Status: DC | PRN
  Administered 2019-08-02: 08:00:00 via INTRAVENOUS
  Administered 2019-08-02: 150 ug/kg/min via INTRAVENOUS

## 2019-08-02 MED ORDER — LACTATED RINGERS IV SOLN
INTRAVENOUS | Status: DC | PRN
  Administered 2019-08-02: 08:00:00 via INTRAVENOUS

## 2019-08-02 MED ORDER — PROPOFOL 10 MG/ML IV BOLUS
INTRAVENOUS | Status: AC
  Filled 2019-08-02: qty 140

## 2019-08-02 MED ORDER — KETAMINE HCL 50 MG/5ML IJ SOSY
PREFILLED_SYRINGE | INTRAMUSCULAR | Status: AC
  Filled 2019-08-02: qty 5

## 2019-08-02 NOTE — Anesthesia Preprocedure Evaluation (Addendum)
Anesthesia Evaluation  Patient identified by MRN, date of birth, ID band Patient awake    Reviewed: Allergy & Precautions, NPO status , Patient's Chart, lab work & pertinent test results  Airway Mallampati: II  TM Distance: >3 FB Neck ROM: Full    Dental  (+) Upper Dentures, Lower Dentures   Pulmonary shortness of breath, pneumonia, COPD, Current Smoker and Patient abstained from smoking.,    Pulmonary exam normal breath sounds clear to auscultation       Cardiovascular hypertension, Pt. on medications + CAD, + Past MI and + Peripheral Vascular Disease  + dysrhythmias  Rhythm:Regular Rate:Normal     Neuro/Psych  Neuromuscular disease    GI/Hepatic GERD  Medicated and Controlled,  Endo/Other  diabetes, Well Controlled, Type 2Morbid obesity  Renal/GU      Musculoskeletal  (+) Arthritis , ACDF   Abdominal   Peds  Hematology   Anesthesia Other Findings Radiculopathy Diabetes mellitus type 2 in obese (HCC) Hypertension associated with diabetes ([place]) Hyperlipidemia associated with type 2 diabetes mellitus (HCC) GERD (gastroesophageal reflux disease) PAOD (peripheral arterial occlusive disease) (HCC) Syncope Smoker Noise-induced hearing loss of both ears Deviated nasal septum Coronary artery disease involving native coronary artery of native heart without angina pectoris 

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
## 2019-08-02 NOTE — Op Note (Signed)
[facility] Patient Name: Matthew Stewart Procedure Date: 08/02/2019 7:08 AM MRN: [id number] Date of Birth: 05/12/1961 Attending MD: Norvel Richards , MD CSN: [id number] Age: 58 Admit Type: Outpatient Procedure:                Upper GI endoscopy Indications:              Dyspepsia Providers:                Norvel Richards, MD, Jeanann Lewandowsky. Sharon Seller, RN,                            Aram Candela Referring MD:              Medicines:                Propofol per Anesthesia Complications:            No immediate complications. Estimated Blood Loss:     Estimated blood loss: none. Procedure:                Pre-Anesthesia Assessment:                           - Prior to the procedure, a History and Physical                            was performed, and patient medications and                            allergies were reviewed. The patient's tolerance of                            previous anesthesia was also reviewed. The risks                            and benefits of the procedure and the sedation                            options and risks were discussed with the patient.                            All questions were answered, and informed consent                            was obtained. Prior Anticoagulants: The patient has                            taken no previous anticoagulant or antiplatelet                            agents. ASA Grade Assessment: III - A patient with                            severe systemic disease. After reviewing the risks  and benefits, the patient was deemed in                            satisfactory condition to undergo the procedure.                           After obtaining informed consent, the endoscope was                            passed under direct vision. Throughout the                            procedure, the patient's blood pressure, pulse, and                            oxygen saturations were monitored  continuously. The                            [id number] ID:[id number]) scope was introduced through the                            mouth, and advanced to the second part of duodenum.                            The upper GI endoscopy was accomplished without                            difficulty. The patient tolerated the procedure                            well. Scope In: 8:05:48 AM Scope Out: 8:13:04 AM Total Procedure Duration: 0 hours 7 minutes 16 seconds  Findings:      Scattered raised cream-colored plaques relying the entire mucosal       esophageal body. Multiple distal esophageal erosions with 1.5 to 2 cm       area of linear ulceration. No Barrett's epithelium or tumor seen.       Tubular esophagus patent throughout its course. Esophagitis was found.      A small hiatal hernia was present. Stomach empty.      The stomach was otherwise without abnormality.      The duodenal bulb and second portion of the duodenum were normal.       Esophageal brushing for which taken. Impression:               -Moderately severe ulcerative reflux esophagitis.                            Esophageal plaques?KOH pending. Gastroparesis not                            ruled out at this point in time.                           - Small hiatal hernia.                           -  The examination was otherwise normal. Tinea                            Protonix 40 mg twice daily.                           - Normal duodenal bulb and second portion of the                            duodenum.                           -Further recommendations to follow pending review                            of pathology report Moderate Sedation:      Moderate (conscious) sedation was personally administered by an       anesthesia professional. The following parameters were monitored: oxygen       saturation, heart rate, blood pressure, respiratory rate, EKG, adequacy       of pulmonary ventilation, and response to  care. Recommendation:            Procedure Code(s):        --- Professional ---                           [phone], Esophagogastroduodenoscopy, flexible,                            transoral; diagnostic, including collection of                            specimen(s) by brushing or washing, when performed                            (separate procedure) Diagnosis Code(s):        --- Professional ---                           K20.90, Esophagitis, unspecified without bleeding                           K44.9, Diaphragmatic hernia without obstruction or                            gangrene                           R10.13, Epigastric pain CPT copyright 2019 American Medical Association. All rights reserved. The codes documented in this report are preliminary and upon coder review may  be revised to meet current compliance requirements. Cristopher Estimable. Sylvan Sookdeo, MD Norvel Richards, MD 08/02/2019 8:28:28 AM This report has been signed electronically. Number of Addenda: 0

## 2019-08-02 NOTE — Anesthesia Postprocedure Evaluation (Signed)
Anesthesia Post Note  Patient: Matthew Stewart  Procedure(s) Performed: ESOPHAGOGASTRODUODENOSCOPY (EGD) WITH PROPOFOL (N/A ) ESOPHAGEAL BRUSHING  Patient location during evaluation: PACU Anesthesia Type: General Level of consciousness: awake and alert, oriented and patient cooperative Pain management: pain level controlled Vital Signs Assessment: post-procedure vital signs reviewed and stable Respiratory status: spontaneous breathing Cardiovascular status: stable Postop Assessment: no apparent nausea or vomiting Anesthetic complications: no     Last Vitals:  Vitals:   08/02/19 0702  BP: 130/84  Pulse: 84  Resp: 16  SpO2: 96%    Last Pain:  Vitals:   08/02/19 0803  TempSrc:   PainSc: 8                  Deshawnda Acrey A

## 2019-08-02 NOTE — Anesthesia Procedure Notes (Signed)
Procedure Name: General with mask airway Date/Time: 08/02/2019 7:59 AM Performed by: Andree Elk, Amy A, CRNA Pre-anesthesia Checklist: Timeout performed, Patient being monitored, Suction available, Emergency Drugs available and Patient identified Oxygen Delivery Method: Non-rebreather mask

## 2019-08-02 NOTE — Transfer of Care (Signed)
Immediate Anesthesia Transfer of Care Note  Patient: Matthew Stewart  Procedure(s) Performed: ESOPHAGOGASTRODUODENOSCOPY (EGD) WITH PROPOFOL (N/A ) ESOPHAGEAL BRUSHING  Patient Location: PACU  Anesthesia Type:General  Level of Consciousness: awake, alert , oriented and patient cooperative  Airway & Oxygen Therapy: Patient Spontanous Breathing  Post-op Assessment: Report given to RN and Post -op Vital signs reviewed and stable  Post vital signs: Reviewed and stable  Last Vitals:  Vitals Value Taken Time  BP 113/76 08/02/19 0823  Temp    Pulse 93 08/02/19 0824  Resp 19 08/02/19 0824  SpO2 93 % 08/02/19 0824  Vitals shown include unvalidated device data.  Last Pain:  Vitals:   08/02/19 0803  TempSrc:   PainSc: 8       Patients Stated Pain Goal: 8 ([id number])  Complications: No apparent anesthesia complications

## 2019-08-02 NOTE — Discharge Instructions (Signed)
EGD Discharge instructions Please read the instructions outlined below and refer to this sheet in the next few weeks. These discharge instructions provide you with general information on caring for yourself after you leave the hospital. Your doctor may also give you specific instructions. While your treatment has been planned according to the most current medical practices available, unavoidable complications occasionally occur. If you have any problems or questions after discharge, please call your doctor. ACTIVITY  You may resume your regular activity but move at a slower pace for the next 24 hours.   Take frequent rest periods for the next 24 hours.   Walking will help expel (get rid of) the air and reduce the bloated feeling in your abdomen.   No driving for 24 hours (because of the anesthesia (medicine) used during the test).   You may shower.   Do not sign any important legal documents or operate any machinery for 24 hours (because of the anesthesia used during the test).  NUTRITION  Drink plenty of fluids.   You may resume your normal diet.   Begin with a light meal and progress to your normal diet.   Avoid alcoholic beverages for 24 hours or as instructed by your caregiver.  MEDICATIONS  You may resume your normal medications unless your caregiver tells you otherwise.  WHAT YOU CAN EXPECT TODAY  You may experience abdominal discomfort such as a feeling of fullness or gas pains.  FOLLOW-UP  Your doctor will discuss the results of your test with you.  SEEK IMMEDIATE MEDICAL ATTENTION IF ANY OF THE FOLLOWING OCCUR:  Excessive nausea (feeling sick to your stomach) and/or vomiting.   Severe abdominal pain and distention (swelling).   Trouble swallowing.   Temperature over 101 F (37.8 C).   Rectal bleeding or vomiting of blood.    Gastroesophageal Reflux Disease, Adult Gastroesophageal reflux (GER) happens when acid from the stomach flows up into the tube that  connects the mouth and the stomach (esophagus). Normally, food travels down the esophagus and stays in the stomach to be digested. With GER, food and stomach acid sometimes move back up into the esophagus. You may have a disease called gastroesophageal reflux disease (GERD) if the reflux:  Happens often.  Causes frequent or very bad symptoms.  Causes problems such as damage to the esophagus. When this happens, the esophagus becomes sore and swollen (inflamed). Over time, GERD can make small holes (ulcers) in the lining of the esophagus. What are the causes? This condition is caused by a problem with the muscle between the esophagus and the stomach. When this muscle is weak or not normal, it does not close properly to keep food and acid from coming back up from the stomach. The muscle can be weak because of:  Tobacco use.  Pregnancy.  Having a certain type of hernia (hiatal hernia).  Alcohol use.  Certain foods and drinks, such as coffee, chocolate, onions, and peppermint. What increases the risk? You are more likely to develop this condition if you:  Are overweight.  Have a disease that affects your connective tissue.  Use NSAID medicines. What are the signs or symptoms? Symptoms of this condition include:  Heartburn.  Difficult or painful swallowing.  The feeling of having a lump in the throat.  A bitter taste in the mouth.  Bad breath.  Having a lot of saliva.  Having an upset or bloated stomach.  Belching.  Chest pain. Different conditions can cause chest pain. Make sure you see  your doctor if you have chest pain.  Shortness of breath or noisy breathing (wheezing).  Ongoing (chronic) cough or a cough at night.  Wearing away of the surface of teeth (tooth enamel).  Weight loss. How is this treated? Treatment will depend on how bad your symptoms are. Your doctor may suggest:  Changes to your diet.  Medicine.  Surgery. Follow these instructions at  home: Eating and drinking   Follow a diet as told by your doctor. You may need to avoid foods and drinks such as: ? Coffee and tea (with or without caffeine). ? Drinks that contain alcohol. ? Energy drinks and sports drinks. ? Bubbly (carbonated) drinks or sodas. ? Chocolate and cocoa. ? Peppermint and mint flavorings. ? Garlic and onions. ? Horseradish. ? Spicy and acidic foods. These include peppers, chili powder, curry powder, vinegar, hot sauces, and BBQ sauce. ? Citrus fruit juices and citrus fruits, such as oranges, lemons, and limes. ? Tomato-based foods. These include red sauce, chili, salsa, and pizza with red sauce. ? Fried and fatty foods. These include donuts, french fries, potato chips, and high-fat dressings. ? High-fat meats. These include hot dogs, rib eye steak, sausage, ham, and bacon. ? High-fat dairy items, such as whole milk, butter, and cream cheese.  Eat small meals often. Avoid eating large meals.  Avoid drinking large amounts of liquid with your meals.  Avoid eating meals during the 2-3 hours before bedtime.  Avoid lying down right after you eat.  Do not exercise right after you eat. Lifestyle   Do not use any products that contain nicotine or tobacco. These include cigarettes, e-cigarettes, and chewing tobacco. If you need help quitting, ask your doctor.  Try to lower your stress. If you need help doing this, ask your doctor.  If you are overweight, lose an amount of weight that is healthy for you. Ask your doctor about a safe weight loss goal. General instructions  Pay attention to any changes in your symptoms.  Take over-the-counter and prescription medicines only as told by your doctor. Do not take aspirin, ibuprofen, or other NSAIDs unless your doctor says it is okay.  Wear loose clothes. Do not wear anything tight around your waist.  Raise (elevate) the head of your bed about 6 inches (15 cm).  Avoid bending over if this makes your  symptoms worse.  Keep all follow-up visits as told by your doctor. This is important. Contact a doctor if:  You have new symptoms.  You lose weight and you do not know why.  You have trouble swallowing or it hurts to swallow.  You have wheezing or a cough that keeps happening.  Your symptoms do not get better with treatment.  You have a hoarse voice. Get help right away if:  You have pain in your arms, neck, jaw, teeth, or back.  You feel sweaty, dizzy, or light-headed.  You have chest pain or shortness of breath.  You throw up (vomit) and your throw-up looks like blood or coffee grounds.  You pass out (faint).  Your poop (stool) is bloody or black.  You cannot swallow, drink, or eat. Summary  If a person has gastroesophageal reflux disease (GERD), food and stomach acid move back up into the esophagus and cause symptoms or problems such as damage to the esophagus.  Treatment will depend on how bad your symptoms are.  Follow a diet as told by your doctor.  Take all medicines only as told by your doctor. This information  is not intended to replace advice given to you by your health care provider. Make sure you discuss any questions you have with your health care provider. Document Released: 01/29/2008 Document Revised: 02/18/2018 Document Reviewed: 02/18/2018 Elsevier Patient Education  2020 [employer].   GERD information provided  Continue Protonix 40 mg twice daily  Further recommendations to follow pending review of pathology report  At patient request, I called his wife, Edmonia Lynch [phone] -got voicemail

## 2019-08-03 ENCOUNTER — Other Ambulatory Visit: Payer: Self-pay

## 2019-08-03 MED ORDER — FLUCONAZOLE 100 MG PO TABS: ORAL_TABLET | ORAL | 0 refills | Status: DC

## 2019-08-03 MED ORDER — PANTOPRAZOLE SODIUM 40 MG PO TBEC: 40.0000 mg | DELAYED_RELEASE_TABLET | Freq: Every day | ORAL | 3 refills | Status: DC

## 2019-08-04 ENCOUNTER — Encounter (HOSPITAL_COMMUNITY): Payer: Self-pay | Admitting: Internal Medicine

## 2019-08-30 ENCOUNTER — Telehealth: Payer: Self-pay | Admitting: Family Medicine

## 2019-08-30 NOTE — Telephone Encounter (Signed)
Pt's medicaid caregiver manager wanted to call and let Dr Dettinger know that patients score on guide 7 was a 16 and score on PHQ9 was 14. Suicide screening was negative. Says she is going to fax [place] all of the info as well.

## 2019-08-30 NOTE — Telephone Encounter (Signed)
Will defer to PCP when he returns tomorrow

## 2019-08-31 NOTE — Telephone Encounter (Signed)
Please schedule patient an appointment to discuss depression anxiety

## 2019-09-01 ENCOUNTER — Telehealth: Payer: Self-pay | Admitting: Family Medicine

## 2019-09-01 NOTE — Telephone Encounter (Signed)
lmtcb

## 2019-09-01 NOTE — Telephone Encounter (Signed)
Baxter Flattery returned call and aware patient ntbs.  Baxter Flattery wanted to let us know some concerns she has with patient.  States that he is having increased lower back pain and motor weakness. States patient is having dysfunction with his bowls and bladder.  Patient either has the urgency and cant go or he goes with out realizing.  Yesterday while patient was driving he had loss of sensation and had to stop driving.   I called patient to schedule a depression and anxiety appointment and also advise patient to go to ER to get evaluated due to bowls and bladder. Patient states that he seen Dr. Orene Desanctis at [facility] 2 weeks ago for his bladder and bowls.  States they thought it was because of a medication or from his lower back pain.  States that he was taken off a medication and now it has improved.  When trying to schedule apt for depression and anxiety patient told me that he has an apt with [facility] on 09/16/19.  Did not schedule since they will manage.      Please advise

## 2019-09-01 NOTE — Telephone Encounter (Signed)
Patient wanted appt with Dettinger appt made

## 2019-09-01 NOTE — Telephone Encounter (Signed)
If patient is having increased lower back pain and weakness, this is something we can evaluate or he can go see his back doctor if he has 1 still.  I am glad that the bowels and bladder have improved, if he has any issues come back then let us know and we can always evaluate.  Please let us know how it goes with [facility] and any way we can help with anxiety as well.

## 2019-09-02 DIAGNOSIS — R002 Palpitations: Secondary | ICD-10-CM | POA: Diagnosis not present

## 2019-09-08 ENCOUNTER — Ambulatory Visit (INDEPENDENT_AMBULATORY_CARE_PROVIDER_SITE_OTHER): Payer: Medicaid Other | Admitting: Family Medicine

## 2019-09-08 ENCOUNTER — Encounter: Payer: Self-pay | Admitting: Family Medicine

## 2019-09-08 DIAGNOSIS — M5136 Other intervertebral disc degeneration, lumbar region: Secondary | ICD-10-CM | POA: Diagnosis not present

## 2019-09-08 DIAGNOSIS — R339 Retention of urine, unspecified: Secondary | ICD-10-CM | POA: Diagnosis not present

## 2019-09-08 DIAGNOSIS — M545 Low back pain, unspecified: Secondary | ICD-10-CM

## 2019-09-08 DIAGNOSIS — G834 Cauda equina syndrome: Secondary | ICD-10-CM | POA: Diagnosis not present

## 2019-09-08 DIAGNOSIS — R159 Full incontinence of feces: Secondary | ICD-10-CM | POA: Diagnosis not present

## 2019-09-08 DIAGNOSIS — R200 Anesthesia of skin: Secondary | ICD-10-CM

## 2019-09-08 NOTE — Progress Notes (Signed)
Virtual Visit via telephone Note  I connected with [name] on 09/08/19 at [facility] by telephone and verified that I am speaking with the correct person using two identifiers. Jatavius Manchego is currently located at home and no other people are currently with her during visit. The provider, Fransisca Kaufmann Hisham Provence, MD is located in their office at time of visit.  Call ended at 0850  I discussed the limitations, risks, security and privacy concerns of performing an evaluation and management service by telephone and the availability of in person appointments. I also discussed with the patient that there may be a patient responsible charge related to this service. The patient expressed understanding and agreed to proceed.   History and Present Illness: Patient is having back pain and it is worse and he is having trouble walking and his back pain is worsening. He is having shots in his back and did not work at all. He has been worsening since then. The last injection was 1st week on November 2020. His lower back in both sides and has numbness and difficulty getting up and down out of chairs and he doesn't feel his bowels and is losing control of bowels.   He sometimes has stool accidents.  It has been worsening over the past 2 months.  1. Degenerative disc disease, lumbar   2. Painless urinary retention due to cauda equina syndrome (HCC)   3. Incontinence of feces, unspecified fecal incontinence type   4. Numbness in both legs   5. Lumbar back pain     Outpatient Encounter Medications as of 09/08/2019  Medication Sig  . albuterol (PROVENTIL HFA;VENTOLIN HFA) 108 (90 Base) MCG/ACT inhaler Inhale 2 puffs into the lungs every 6 (six) hours as needed for wheezing or shortness of breath.  . fluconazole (DIFLUCAN) 100 MG tablet Take 2 tabs on day 1 (200mg ), then take 1 tab for 21 days.  . pantoprazole (PROTONIX) 40 MG tablet Take 40 mg by mouth 2 (two) times daily.   . pantoprazole (PROTONIX) 40 MG  tablet Take 1 tablet (40 mg total) by mouth daily.  . Semaglutide,0.25 or 0.5MG /DOS, (OZEMPIC, 0.25 OR 0.5 MG/DOSE,) 2 MG/1.5ML SOPN Inject 0.5 mg into the skin once a week.   No facility-administered encounter medications on file as of 09/08/2019.    Review of Systems  Constitutional: Negative for chills and fever.  Eyes: Negative for visual disturbance.  Respiratory: Negative for shortness of breath and wheezing.   Cardiovascular: Negative for chest pain and leg swelling.  Genitourinary: Positive for decreased urine volume and difficulty urinating.  Musculoskeletal: Negative for back pain and gait problem.  Skin: Negative for rash.  Neurological: Positive for weakness and numbness. Negative for dizziness.  All other systems reviewed and are negative.   Observations/Objective: Patient sounds comfortable and in no acute distress  Assessment and Plan: Problem List Items Addressed This Visit      Musculoskeletal and Integument   Degenerative disc disease, lumbar - Primary   Relevant Orders   MR Lumbar Spine Wo Contrast    Other Visit Diagnoses    Painless urinary retention due to cauda equina syndrome [facility])       Relevant Orders   MR Lumbar Spine Wo Contrast   Incontinence of feces, unspecified fecal incontinence type       Relevant Orders   MR Lumbar Spine Wo Contrast   Numbness in both legs       Relevant Orders   MR Lumbar Spine Wo Contrast  Lumbar back pain       Relevant Orders   MR Lumbar Spine Wo Contrast      Patient is having numbness and weakness in both of his lower extremities that has progressed and he is starting to have loss of bowel and loss of control because he cannot feel that area and difficulty urinating as well and get some urinary retention its not painful but he just cannot urinate.  Ordered MRI stat Follow up plan: Return in about 6 weeks (around 10/20/2019), or if symptoms worsen or fail to improve, for f/u back.     I discussed the  assessment and treatment plan with the patient. The patient was provided an opportunity to ask questions and all were answered. The patient agreed with the plan and demonstrated an understanding of the instructions.   The patient was advised to call back or seek an in-person evaluation if the symptoms worsen or if the condition fails to improve as anticipated.  The above assessment and management plan was discussed with the patient. The patient verbalized understanding of and has agreed to the management plan. Patient is aware to call the clinic if symptoms persist or worsen. Patient is aware when to return to the clinic for a follow-up visit. Patient educated on when it is appropriate to go to the emergency department.    I provided 10 minutes of non-face-to-face time during this encounter.    Worthy Rancher, MD

## 2019-09-09 ENCOUNTER — Ambulatory Visit (HOSPITAL_COMMUNITY)
Admission: RE | Admit: 2019-09-09 | Discharge: 2019-09-09 | Disposition: A | Discharge status: Home / Self Care | Payer: Medicaid Other | Source: Ambulatory Visit | Attending: Family Medicine | Admitting: Family Medicine

## 2019-09-09 ENCOUNTER — Other Ambulatory Visit: Payer: Self-pay

## 2019-09-09 DIAGNOSIS — R159 Full incontinence of feces: Secondary | ICD-10-CM | POA: Diagnosis not present

## 2019-09-09 DIAGNOSIS — M545 Low back pain, unspecified: Secondary | ICD-10-CM

## 2019-09-09 DIAGNOSIS — R200 Anesthesia of skin: Secondary | ICD-10-CM | POA: Diagnosis not present

## 2019-09-09 DIAGNOSIS — R339 Retention of urine, unspecified: Secondary | ICD-10-CM | POA: Insufficient documentation

## 2019-09-09 DIAGNOSIS — G834 Cauda equina syndrome: Secondary | ICD-10-CM

## 2019-09-09 DIAGNOSIS — M5136 Other intervertebral disc degeneration, lumbar region: Secondary | ICD-10-CM | POA: Diagnosis not present

## 2019-09-10 ENCOUNTER — Telehealth: Payer: Self-pay | Admitting: Family Medicine

## 2019-09-10 NOTE — Telephone Encounter (Signed)
Patient wants a print out of result once provider has signed.

## 2019-09-10 NOTE — Telephone Encounter (Signed)
Okay to printed out for him, I went ahead and did the result note as well.

## 2019-09-10 NOTE — Telephone Encounter (Signed)
Aware. Copy of MRI ready.

## 2019-09-14 DIAGNOSIS — G629 Polyneuropathy, unspecified: Secondary | ICD-10-CM | POA: Diagnosis not present

## 2019-09-14 DIAGNOSIS — M545 Low back pain: Secondary | ICD-10-CM | POA: Diagnosis not present

## 2019-09-14 DIAGNOSIS — M4716 Other spondylosis with myelopathy, lumbar region: Secondary | ICD-10-CM | POA: Diagnosis not present

## 2019-09-14 DIAGNOSIS — M48061 Spinal stenosis, lumbar region without neurogenic claudication: Secondary | ICD-10-CM | POA: Diagnosis not present

## 2019-09-16 ENCOUNTER — Encounter: Payer: Self-pay | Admitting: Family Medicine

## 2019-09-16 ENCOUNTER — Ambulatory Visit (INDEPENDENT_AMBULATORY_CARE_PROVIDER_SITE_OTHER): Payer: Medicaid Other | Admitting: Family Medicine

## 2019-09-16 DIAGNOSIS — M5136 Other intervertebral disc degeneration, lumbar region: Secondary | ICD-10-CM | POA: Diagnosis not present

## 2019-09-16 DIAGNOSIS — Z716 Tobacco abuse counseling: Secondary | ICD-10-CM | POA: Diagnosis not present

## 2019-09-16 MED ORDER — NICOTINE 21-14-7 MG/24HR TD KIT: 1.0000 | PACK | Freq: Every day | TRANSDERMAL | 0 refills | Status: DC

## 2019-09-16 NOTE — Progress Notes (Signed)
Virtual Visit via telephone Note  I connected with Matthew Stewart on 09/16/19 at 1157 by telephone and verified that I am speaking with the correct person using two identifiers. Matthew Stewart is currently located at home and no other people are currently with her during visit. The provider, Fransisca Kaufmann Abu Heavin, MD is located in their office at time of visit.  Call ended at 1205  I discussed the limitations, risks, security and privacy concerns of performing an evaluation and management service by telephone and the availability of in person appointments. I also discussed with the patient that there may be a patient responsible charge related to this service. The patient expressed understanding and agreed to proceed.   History and Present Illness: Smoking cessation counseling Patient is wanting smoking cessation and wants to try patches, he is possibly looking into possible surgery in the 1 to do smoking cessation for him.  Patient has chronic back and degenerative disc disease in his back which causes low back pain which is been chronic.  He did see an orthopedic for this and Dr. Lurene Shadow but he would like to see a different specialist, did not like him as much.  Outpatient Encounter Medications as of 09/16/2019  Medication Sig  . albuterol (PROVENTIL HFA;VENTOLIN HFA) 108 (90 Base) MCG/ACT inhaler Inhale 2 puffs into the lungs every 6 (six) hours as needed for wheezing or shortness of breath.  . fluconazole (DIFLUCAN) 100 MG tablet Take 2 tabs on day 1 (25m), then take 1 tab for 21 days.  . Nicotine 21-14-7 MG/24HR KIT Place 1 patch onto the skin daily.  . pantoprazole (PROTONIX) 40 MG tablet Take 40 mg by mouth 2 (two) times daily.   . pantoprazole (PROTONIX) 40 MG tablet Take 1 tablet (40 mg total) by mouth daily.  . Semaglutide,0.25 or 0.5MG/DOS, (OZEMPIC, 0.25 OR 0.5 MG/DOSE,) 2 MG/1.5ML SOPN Inject 0.5 mg into the skin once a week.   No facility-administered encounter medications on  file as of 09/16/2019.    Review of Systems  Constitutional: Negative for chills and fever.  Eyes: Negative for visual disturbance.  Respiratory: Negative for shortness of breath and wheezing.   Cardiovascular: Negative for chest pain and leg swelling.  Musculoskeletal: Positive for back pain. Negative for gait problem.  Skin: Negative for rash.  All other systems reviewed and are negative.   Observations/Objective: Patient sounds comfortable and in no acute distress  Assessment and Plan: Problem List Items Addressed This Visit      Musculoskeletal and Integument   Degenerative disc disease, lumbar   Relevant Orders   Ambulatory referral to Orthopedic Surgery    Other Visit Diagnoses    Encounter for smoking cessation counseling    -  Primary   Relevant Medications   Nicotine 21-14-7 MG/24HR KIT       Follow up plan: Return if symptoms worsen or fail to improve.     I discussed the assessment and treatment plan with the patient. The patient was provided an opportunity to ask questions and all were answered. The patient agreed with the plan and demonstrated an understanding of the instructions.   The patient was advised to call back or seek an in-person evaluation if the symptoms worsen or if the condition fails to improve as anticipated.  The above assessment and management plan was discussed with the patient. The patient verbalized understanding of and has agreed to the management plan. Patient is aware to call the clinic if symptoms persist or worsen. Patient  is aware when to return to the clinic for a follow-up visit. Patient educated on when it is appropriate to go to the emergency department.    I provided 8 minutes of non-face-to-face time during this encounter.    Worthy Rancher, MD

## 2019-09-23 ENCOUNTER — Other Ambulatory Visit: Payer: Self-pay | Admitting: Family Medicine

## 2019-10-04 DIAGNOSIS — M546 Pain in thoracic spine: Secondary | ICD-10-CM | POA: Diagnosis not present

## 2019-10-04 DIAGNOSIS — M542 Cervicalgia: Secondary | ICD-10-CM | POA: Diagnosis not present

## 2019-10-04 DIAGNOSIS — M545 Low back pain: Secondary | ICD-10-CM | POA: Diagnosis not present
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
## 2019-10-06 ENCOUNTER — Other Ambulatory Visit: Payer: Self-pay

## 2019-10-06 ENCOUNTER — Ambulatory Visit: Payer: Medicaid Other | Admitting: Gastroenterology

## 2019-10-06 ENCOUNTER — Encounter: Payer: Self-pay | Admitting: Gastroenterology

## 2019-10-06 VITALS — BP 128/81 | HR 71 | Temp 97.3°F | Ht 76.0 in | Wt 256.6 lb

## 2019-10-06 DIAGNOSIS — R197 Diarrhea, unspecified: Secondary | ICD-10-CM | POA: Diagnosis not present

## 2019-10-06 DIAGNOSIS — K2100 Gastro-esophageal reflux disease with esophagitis, without bleeding: Secondary | ICD-10-CM | POA: Diagnosis not present

## 2019-10-06 DIAGNOSIS — R6881 Early satiety: Secondary | ICD-10-CM | POA: Diagnosis not present

## 2019-10-06 MED ORDER — CLENPIQ 10-3.5-12 MG-GM -GM/160ML PO SOLN: 1.0000 | Freq: Once | ORAL | 0 refills | Status: AC

## 2019-10-06 NOTE — Assessment & Plan Note (Signed)
59 year old male with history of diabetes mellitus presenting for follow-up of reflux esophagitis, Candida esophagitis.  Reflux symptoms better controlled on pantoprazole 40 mg twice daily.  Continues to have issues with early satiety, poor appetite, weight loss.  Fearful of eating due to symptoms of diarrhea postprandially.  Initially felt like symptoms were predominantly associated with eating meat but now with meat avoidance, he continues to be symptomatic.  Suspect he does have underlying diabetic gastroparesis.  Discussed possible gastric emptying study but at this time management would not change.  Recommend small meals 5-6 times daily.  Consider adding protein shake with low sugar such as Premier protein given poor protein intake at this time.  Need to monitor weight.  Encourage adequate calorie consumption to prevent weight loss.

## 2019-10-06 NOTE — Patient Instructions (Addendum)
Reflux esophagitis/acid reflux disease: Continue pantoprazole 40 mg 1-2 times daily as needed to control any heartburn symptoms.  Best to take 30 minutes before a meal.  Avoid any foods that cause heartburn.  See handout below.  Possible delayed gastric emptying due to your diabetes: Suspect that you have delayed gastric emptying.  We could offer the "egg test" to diagnosis but at this time the management would be the same.  Instead would recommend eating small portions every 2-3 hours throughout the day.  Avoid fatty/greasy foods.  These are harder to digest.  Try increasing your protein, if needed add a protein shake such as Premier protein which has very little sugar.  Diarrhea: Plan for colonoscopy in the near future to further evaluate your diarrhea as well as follow-up on history of colon polyps.  I would like for you to go for a lab to rule out celiac disease which can cause digestive issues/diarrhea and is often associated with diabetes.   Food Choices for Gastroesophageal Reflux Disease, Adult When you have gastroesophageal reflux disease (GERD), the foods you eat and your eating habits are very important. Choosing the right foods can help ease the discomfort of GERD. Consider working with a diet and nutrition specialist (dietitian) to help you make healthy food choices. What general guidelines should I follow?  Eating plan  Choose healthy foods low in fat, such as fruits, vegetables, whole grains, low-fat dairy products, and lean meat, fish, and poultry.  Eat frequent, small meals instead of three large meals each day. Eat your meals slowly, in a relaxed setting. Avoid bending over or lying down until 2-3 hours after eating.  Limit high-fat foods such as fatty meats or fried foods.  Limit your intake of oils, butter, and shortening to less than 8 teaspoons each day.  Avoid the following: ? Foods that cause symptoms. These may be different for different people. Keep a food diary to  keep track of foods that cause symptoms. ? Alcohol. ? Drinking large amounts of liquid with meals. ? Eating meals during the 2-3 hours before bed.  Cook foods using methods other than frying. This may include baking, grilling, or broiling. Lifestyle  Maintain a healthy weight. Ask your health care provider what weight is healthy for you. If you need to lose weight, work with your health care provider to do so safely.  Exercise for at least 30 minutes on 5 or more days each week, or as told by your health care provider.  Avoid wearing clothes that fit tightly around your waist and chest.  Do not use any products that contain nicotine or tobacco, such as cigarettes and e-cigarettes. If you need help quitting, ask your health care provider.  Sleep with the head of your bed raised. Use a wedge under the mattress or blocks under the bed frame to raise the head of the bed. What foods are not recommended? The items listed may not be a complete list. Talk with your dietitian about what dietary choices are best for you. Grains Pastries or quick breads with added fat. [place] toast. Vegetables Deep fried vegetables. [place] fries. Any vegetables prepared with added fat. Any vegetables that cause symptoms. For some people this may include tomatoes and tomato products, chili peppers, onions and garlic, and horseradish. Fruits Any fruits prepared with added fat. Any fruits that cause symptoms. For some people this may include citrus fruits, such as oranges, grapefruit, pineapple, and lemons. Meats and other protein foods High-fat meats, such as fatty  beef or pork, hot dogs, ribs, ham, sausage, salami and bacon. Fried meat or protein, including fried fish and fried chicken. Nuts and nut butters. Dairy Whole milk and chocolate milk. Sour cream. Cream. Ice cream. Cream cheese. Milk shakes. Beverages Coffee and tea, with or without caffeine. Carbonated beverages. Sodas. Energy drinks. Fruit juice made  with acidic fruits (such as orange or grapefruit). Tomato juice. Alcoholic drinks. Fats and oils Butter. Margarine. Shortening. Ghee. Sweets and desserts Chocolate and cocoa. Donuts. Seasoning and other foods Pepper. Peppermint and spearmint. Any condiments, herbs, or seasonings that cause symptoms. For some people, this may include curry, hot sauce, or vinegar-based salad dressings. Summary  When you have gastroesophageal reflux disease (GERD), food and lifestyle choices are very important to help ease the discomfort of GERD.  Eat frequent, small meals instead of three large meals each day. Eat your meals slowly, in a relaxed setting. Avoid bending over or lying down until 2-3 hours after eating.  Limit high-fat foods such as fatty meat or fried foods. This information is not intended to replace advice given to you by your health care provider. Make sure you discuss any questions you have with your health care provider. Document Revised: 12/03/2018 Document Reviewed: 08/13/2016 Elsevier Patient Education  [place].   Gastroparesis  Gastroparesis is a condition in which food takes longer than normal to empty from the stomach. The condition is usually long-lasting (chronic). It may also be called delayed gastric emptying. There is no cure, but there are treatments and things that you can do at home to help relieve symptoms. Treating the underlying condition that causes gastroparesis can also help relieve symptoms. What are the causes? In many cases, the cause of this condition is not known. Possible causes include:  A hormone (endocrine) disorder, such as hypothyroidism or diabetes.  A nervous system disease, such as Parkinson's disease or multiple sclerosis.  Cancer, infection, or surgery that affects the stomach or vagus nerve. The vagus nerve runs from your chest, through your neck, to the lower part of your brain.  A connective tissue disorder, such as  scleroderma.  Certain medicines. What increases the risk? You are more likely to develop this condition if you:  Have certain disorders or diseases, including: ? An endocrine disorder. ? An eating disorder. ? Amyloidosis. ? Scleroderma. ? Parkinson's disease. ? Multiple sclerosis. ? Cancer or infection of the stomach or the vagus nerve.  Have had surgery on the stomach or vagus nerve.  Take certain medicines.  Are male. What are the signs or symptoms? Symptoms of this condition include:  Feeling full after eating very little.  Nausea.  Vomiting.  Heartburn.  Abdominal bloating.  Inconsistent blood sugar (glucose) levels on blood tests.  Lack of appetite.  Weight loss.  Acid from the stomach coming up into the esophagus (gastroesophageal reflux).  Sudden tightening (spasm) of the stomach, which can be painful. Symptoms may come and go. Some people may not notice any symptoms. How is this diagnosed? This condition is diagnosed with tests, such as:  Tests that check how long it takes food to move through the stomach and intestines. These tests include: ? Upper gastrointestinal (GI) series. For this test, you drink a liquid that shows up well on X-rays, and then X-rays will be taken of your intestines. ? Gastric emptying scintigraphy. For this test, you eat food that contains a small amount of radioactive material, and then scans are taken. ? Wireless capsule GI monitoring system. For this  test, you swallow a pill (capsule) that records information about how foods and fluid move through your stomach.  Gastric manometry. For this test, a tube is passed down your throat and into your stomach to measure electrical and muscular activity.  Endoscopy. For this test, a long, thin tube is passed down your throat and into your stomach to check for problems in your stomach lining.  Ultrasound. This test uses sound waves to create images of inside the body. This can help  rule out gallbladder disease or pancreatitis as a cause of your symptoms. How is this treated? There is no cure for gastroparesis. Treatment may include:  Treating the underlying cause.  Managing your symptoms by making changes to your diet and exercise habits.  Taking medicines to control nausea and vomiting and to stimulate stomach muscles.  Getting food through a feeding tube in the hospital. This may be done in severe cases.  Having surgery to insert a device into your body that helps improve stomach emptying and control nausea and vomiting (gastric neurostimulator). Follow these instructions at home:  Take over-the-counter and prescription medicines only as told by your health care provider.  Follow instructions from your health care provider about eating or drinking restrictions. Your health care provider may recommend that you: ? Eat smaller meals more often. ? Eat low-fat foods. ? Eat low-fiber forms of high-fiber foods. For example, eat cooked vegetables instead of raw vegetables. ? Have only liquid foods instead of solid foods. Liquid foods are easier to digest.  Drink enough fluid to keep your urine pale yellow.  Exercise as often as told by your health care provider.  Keep all follow-up visits as told by your health care provider. This is important. Contact a health care provider if you:  Notice that your symptoms do not improve with treatment.  Have new symptoms. Get help right away if you:  Have severe abdominal pain that does not improve with treatment.  Have nausea that is severe or does not go away.  Cannot drink fluids without vomiting. Summary  Gastroparesis is a chronic condition in which food takes longer than normal to empty from the stomach.  Symptoms include nausea, vomiting, heartburn, abdominal bloating, and loss of appetite.  Eating smaller portions, and low-fat, low-fiber foods may help you manage your symptoms.  Get help right away if you  have severe abdominal pain. This information is not intended to replace advice given to you by your health care provider. Make sure you discuss any questions you have with your health care provider. Document Revised: 11/10/2017 Document Reviewed: 06/17/2017 Elsevier Patient Education  2020 [employer].

## 2019-10-06 NOTE — Assessment & Plan Note (Signed)
Chronic diarrhea greater than 6 months.  History of colonic adenomas.  Screen for celiac disease.  We will check serologies.  Of note duodenum appeared normal on recent EGD.  Plan for colonoscopy with deep sedation in the near future.  Consider random colon biopsies endoscopically appears normal.  I have discussed the risks, alternatives, benefits with regards to but not limited to the risk of reaction to medication, bleeding, infection, perforation and the patient is agreeable to proceed. Written consent to be obtained.

## 2019-10-07 NOTE — Progress Notes (Signed)
Cc'ed to pcp °

## 2019-10-12 ENCOUNTER — Other Ambulatory Visit: Payer: Self-pay | Admitting: Orthopedic Surgery

## 2019-10-12 DIAGNOSIS — M542 Cervicalgia: Secondary | ICD-10-CM

## 2019-10-12 DIAGNOSIS — M546 Pain in thoracic spine: Secondary | ICD-10-CM

## 2019-10-18 ENCOUNTER — Other Ambulatory Visit: Payer: Self-pay | Admitting: Family Medicine

## 2019-11-06 ENCOUNTER — Other Ambulatory Visit: Payer: Self-pay

## 2019-11-06 ENCOUNTER — Ambulatory Visit
Admission: RE | Admit: 2019-11-06 | Discharge: 2019-11-06 | Disposition: A | Discharge status: Home / Self Care | Payer: Medicaid Other | Source: Ambulatory Visit | Attending: Orthopedic Surgery | Admitting: Orthopedic Surgery

## 2019-11-06 DIAGNOSIS — G959 Disease of spinal cord, unspecified: Secondary | ICD-10-CM | POA: Diagnosis not present

## 2019-11-06 DIAGNOSIS — M546 Pain in thoracic spine: Secondary | ICD-10-CM

## 2019-11-06 DIAGNOSIS — M542 Cervicalgia: Secondary | ICD-10-CM

## 2019-11-09 DIAGNOSIS — M542 Cervicalgia: Secondary | ICD-10-CM | POA: Diagnosis not present

## 2019-11-09 DIAGNOSIS — M546 Pain in thoracic spine: Secondary | ICD-10-CM | POA: Diagnosis not present

## 2019-11-10 DIAGNOSIS — M47816 Spondylosis without myelopathy or radiculopathy, lumbar region: Secondary | ICD-10-CM | POA: Diagnosis not present

## 2019-11-25 ENCOUNTER — Other Ambulatory Visit: Payer: Self-pay | Admitting: *Deleted

## 2019-11-25 ENCOUNTER — Telehealth: Payer: Self-pay | Admitting: Family Medicine

## 2019-11-25 NOTE — Telephone Encounter (Signed)
  Prescription Request  11/25/2019  What is the name of the medication or equipment? Inhaler  Have you contacted your pharmacy to request a refill? (if applicable) Yes  Which pharmacy would you like this sent to? Switched to [facility]  Pt's wife called stating that they recently switched pharmacies and lost the box that the inhaler came in. Requesting that a Rx be sent to [facility].   Patient notified that their request is being sent to the clinical staff for review and that they should receive a response within 2 business days.

## 2019-11-25 NOTE — Telephone Encounter (Signed)
Patient requesting refill on inhaler. Please send to [facility] if appropriate.  Allergy alert noted on script?

## 2019-11-29 MED ORDER — ALBUTEROL SULFATE HFA 108 (90 BASE) MCG/ACT IN AERS: 2.0000 | INHALATION_SPRAY | Freq: Four times a day (QID) | RESPIRATORY_TRACT | 5 refills | Status: DC | PRN

## 2019-11-29 NOTE — Telephone Encounter (Signed)
I sent in refill for patient's inhaler

## 2019-12-09 ENCOUNTER — Other Ambulatory Visit: Payer: Self-pay | Admitting: Family Medicine

## 2019-12-13 NOTE — Patient Instructions (Signed)
Matthew Stewart  12/13/2019     @PREFPERIOPPHARMACY @   Your procedure is scheduled on  12/16/2019 .  Report to [facility] at  1200  P.M.  Call this number if you have problems the morning of surgery:  [phone]   Remember:  Follow the diet instructions given to you by Dr Roseanne Kaufman office.                     Take these medicines the morning of surgery with A SIP OF WATER  Pepcid, protonix.    Do not wear jewelry, make-up or nail polish.  Do not wear lotions, powders, or perfumes. Please wear deodorant and brush your teeth.  Do not shave 48 hours prior to surgery.  Men may shave face and neck.  Do not bring valuables to the hospital.  [facility] is not responsible for any belongings or valuables.  Contacts, dentures or bridgework may not be worn into surgery.  Leave your suitcase in the car.  After surgery it may be brought to your room.  For patients admitted to the hospital, discharge time will be determined by your treatment team.  Patients discharged the day of surgery will not be allowed to drive home.   Name and phone number of your driver:   family Special instructions:  None  Please read over the following fact sheets that you were given. Anesthesia Post-op Instructions and Care and Recovery After Surgery       Colonoscopy, Adult, Care After This sheet gives you information about how to care for yourself after your procedure. Your health care provider may also give you more specific instructions. If you have problems or questions, contact your health care provider. What can I expect after the procedure? After the procedure, it is common to have:  A small amount of blood in your stool for 24 hours after the procedure.  Some gas.  Mild cramping or bloating of your abdomen. Follow these instructions at home: Eating and drinking   Drink enough fluid to keep your urine pale yellow.  Follow instructions from your health care provider about eating or  drinking restrictions.  Resume your normal diet as instructed by your health care provider. Avoid heavy or fried foods that are hard to digest. Activity  Rest as told by your health care provider.  Avoid sitting for a long time without moving. Get up to take short walks every 1-2 hours. This is important to improve blood flow and breathing. Ask for help if you feel weak or unsteady.  Return to your normal activities as told by your health care provider. Ask your health care provider what activities are safe for you. Managing cramping and bloating   Try walking around when you have cramps or feel bloated.  Apply heat to your abdomen as told by your health care provider. Use the heat source that your health care provider recommends, such as a moist heat pack or a heating pad. ? Place a towel between your skin and the heat source. ? Leave the heat on for 20-30 minutes. ? Remove the heat if your skin turns bright red. This is especially important if you are unable to feel pain, heat, or cold. You may have a greater risk of getting burned. General instructions  For the first 24 hours after the procedure: ? Do not drive or use machinery. ? Do not sign important documents. ? Do not drink alcohol. ? Do your  regular daily activities at a slower pace than normal. ? Eat soft foods that are easy to digest.  Take over-the-counter and prescription medicines only as told by your health care provider.  Keep all follow-up visits as told by your health care provider. This is important. Contact a health care provider if:  You have blood in your stool 2-3 days after the procedure. Get help right away if you have:  More than a small spotting of blood in your stool.  Large blood clots in your stool.  Swelling of your abdomen.  Nausea or vomiting.  A fever.  Increasing pain in your abdomen that is not relieved with medicine. Summary  After the procedure, it is common to have a small amount  of blood in your stool. You may also have mild cramping and bloating of your abdomen.  For the first 24 hours after the procedure, do not drive or use machinery, sign important documents, or drink alcohol.  Get help right away if you have a lot of blood in your stool, nausea or vomiting, a fever, or increased pain in your abdomen. This information is not intended to replace advice given to you by your health care provider. Make sure you discuss any questions you have with your health care provider. Document Revised: 03/08/2019 Document Reviewed: 03/08/2019 Elsevier Patient Education  [place] After These instructions provide you with information about caring for yourself after your procedure. Your health care provider may also give you more specific instructions. Your treatment has been planned according to current medical practices, but problems sometimes occur. Call your health care provider if you have any problems or questions after your procedure. What can I expect after the procedure? After your procedure, you may:  Feel sleepy for several hours.  Feel clumsy and have poor balance for several hours.  Feel forgetful about what happened after the procedure.  Have poor judgment for several hours.  Feel nauseous or vomit.  Have a sore throat if you had a breathing tube during the procedure. Follow these instructions at home: For at least 24 hours after the procedure:      Have a responsible adult stay with you. It is important to have someone help care for you until you are awake and alert.  Rest as needed.  Do not: ? Participate in activities in which you could fall or become injured. ? Drive. ? Use heavy machinery. ? Drink alcohol. ? Take sleeping pills or medicines that cause drowsiness. ? Make important decisions or sign legal documents. ? Take care of children on your own. Eating and drinking  Follow the diet that is  recommended by your health care provider.  If you vomit, drink water, juice, or soup when you can drink without vomiting.  Make sure you have little or no nausea before eating solid foods. General instructions  Take over-the-counter and prescription medicines only as told by your health care provider.  If you have sleep apnea, surgery and certain medicines can increase your risk for breathing problems. Follow instructions from your health care provider about wearing your sleep device: ? Anytime you are sleeping, including during daytime naps. ? While taking prescription pain medicines, sleeping medicines, or medicines that make you drowsy.  If you smoke, do not smoke without supervision.  Keep all follow-up visits as told by your health care provider. This is important. Contact a health care provider if:  You keep feeling nauseous or you keep vomiting.  You feel light-headed.  You develop a rash.  You have a fever. Get help right away if:  You have trouble breathing. Summary  For several hours after your procedure, you may feel sleepy and have poor judgment.  Have a responsible adult stay with you for at least 24 hours or until you are awake and alert. This information is not intended to replace advice given to you by your health care provider. Make sure you discuss any questions you have with your health care provider. Document Revised: 11/10/2017 Document Reviewed: 12/03/2015 Elsevier Patient Education  [place].

## 2019-12-14 ENCOUNTER — Other Ambulatory Visit (HOSPITAL_COMMUNITY)
Admission: RE | Admit: 2019-12-14 | Discharge: 2019-12-14 | Disposition: A | Discharge status: Home / Self Care | Payer: Medicaid Other | Source: Ambulatory Visit | Attending: Internal Medicine | Admitting: Internal Medicine

## 2019-12-14 ENCOUNTER — Other Ambulatory Visit: Payer: Self-pay

## 2019-12-14 ENCOUNTER — Encounter (HOSPITAL_COMMUNITY): Payer: Self-pay

## 2019-12-14 ENCOUNTER — Encounter (HOSPITAL_COMMUNITY)
Admission: RE | Admit: 2019-12-14 | Discharge: 2019-12-14 | Disposition: A | Discharge status: Home / Self Care | Payer: Medicaid Other | Source: Ambulatory Visit | Attending: Internal Medicine | Admitting: Internal Medicine

## 2019-12-14 DIAGNOSIS — R197 Diarrhea, unspecified: Secondary | ICD-10-CM | POA: Diagnosis not present

## 2019-12-14 DIAGNOSIS — Z01812 Encounter for preprocedural laboratory examination: Secondary | ICD-10-CM | POA: Diagnosis not present

## 2019-12-14 DIAGNOSIS — Z20822 Contact with and (suspected) exposure to covid-19: Secondary | ICD-10-CM | POA: Insufficient documentation

## 2019-12-14 DIAGNOSIS — R6881 Early satiety: Secondary | ICD-10-CM | POA: Diagnosis not present

## 2019-12-14 DIAGNOSIS — K2100 Gastro-esophageal reflux disease with esophagitis, without bleeding: Secondary | ICD-10-CM | POA: Diagnosis not present

## 2019-12-14 HISTORY — DX: Cerebral infarction, unspecified: I63.9

## 2019-12-14 HISTORY — DX: Sleep apnea, unspecified: G47.30

## 2019-12-14 LAB — BASIC METABOLIC PANEL
Anion gap: 12 (ref 5–15)
BUN: 11 mg/dL (ref 6–20)
CO2: 21 mmol/L — ABNORMAL LOW (ref 22–32)
Calcium: 9.1 mg/dL (ref 8.9–10.3)
Chloride: 102 mmol/L (ref 98–111)
Creatinine, Ser: 0.76 mg/dL (ref 0.61–1.24)
GFR calc Af Amer: >60 mL/min (ref >60)
GFR calc non Af Amer: >60 mL/min (ref >60)
Glucose, Bld: 191 mg/dL — ABNORMAL HIGH (ref 70–99)
Potassium: 4.1 mmol/L (ref 3.5–5.1)
Sodium: 135 mmol/L (ref 135–145)

## 2019-12-15 LAB — SARS CORONAVIRUS 2 (TAT 6-24 HRS): SARS Coronavirus 2: NEGATIVE

## 2019-12-16 ENCOUNTER — Encounter (HOSPITAL_COMMUNITY)
Admission: RE | Disposition: A | Discharge status: Home / Self Care | Payer: Self-pay | Source: Home / Self Care | Attending: Internal Medicine

## 2019-12-16 ENCOUNTER — Ambulatory Visit (HOSPITAL_COMMUNITY)
Admission: RE | Admit: 2019-12-16 | Discharge: 2019-12-16 | Disposition: A | Discharge status: Home / Self Care | Payer: Medicaid Other | Attending: Internal Medicine | Admitting: Internal Medicine

## 2019-12-16 ENCOUNTER — Other Ambulatory Visit: Payer: Self-pay

## 2019-12-16 ENCOUNTER — Ambulatory Visit (HOSPITAL_COMMUNITY): Payer: Medicaid Other | Admitting: Anesthesiology

## 2019-12-16 ENCOUNTER — Encounter (HOSPITAL_COMMUNITY): Payer: Self-pay | Admitting: Internal Medicine

## 2019-12-16 DIAGNOSIS — E119 Type 2 diabetes mellitus without complications: Secondary | ICD-10-CM | POA: Diagnosis not present

## 2019-12-16 DIAGNOSIS — K529 Noninfective gastroenteritis and colitis, unspecified: Secondary | ICD-10-CM

## 2019-12-16 DIAGNOSIS — Z981 Arthrodesis status: Secondary | ICD-10-CM | POA: Diagnosis not present

## 2019-12-16 DIAGNOSIS — Z79899 Other long term (current) drug therapy: Secondary | ICD-10-CM | POA: Insufficient documentation

## 2019-12-16 DIAGNOSIS — K635 Polyp of colon: Secondary | ICD-10-CM

## 2019-12-16 DIAGNOSIS — F17210 Nicotine dependence, cigarettes, uncomplicated: Secondary | ICD-10-CM | POA: Diagnosis not present

## 2019-12-16 DIAGNOSIS — Z8673 Personal history of transient ischemic attack (TIA), and cerebral infarction without residual deficits: Secondary | ICD-10-CM | POA: Insufficient documentation

## 2019-12-16 DIAGNOSIS — J449 Chronic obstructive pulmonary disease, unspecified: Secondary | ICD-10-CM | POA: Diagnosis not present

## 2019-12-16 DIAGNOSIS — D123 Benign neoplasm of transverse colon: Secondary | ICD-10-CM | POA: Diagnosis not present

## 2019-12-16 DIAGNOSIS — Z86010 Personal history of colonic polyps: Secondary | ICD-10-CM | POA: Diagnosis not present

## 2019-12-16 DIAGNOSIS — M1990 Unspecified osteoarthritis, unspecified site: Secondary | ICD-10-CM | POA: Insufficient documentation

## 2019-12-16 DIAGNOSIS — I10 Essential (primary) hypertension: Secondary | ICD-10-CM | POA: Insufficient documentation

## 2019-12-16 DIAGNOSIS — I2510 Atherosclerotic heart disease of native coronary artery without angina pectoris: Secondary | ICD-10-CM | POA: Diagnosis not present

## 2019-12-16 DIAGNOSIS — K219 Gastro-esophageal reflux disease without esophagitis: Secondary | ICD-10-CM | POA: Diagnosis not present

## 2019-12-16 DIAGNOSIS — I4891 Unspecified atrial fibrillation: Secondary | ICD-10-CM | POA: Diagnosis not present

## 2019-12-16 DIAGNOSIS — E785 Hyperlipidemia, unspecified: Secondary | ICD-10-CM | POA: Insufficient documentation

## 2019-12-16 DIAGNOSIS — G4730 Sleep apnea, unspecified: Secondary | ICD-10-CM | POA: Diagnosis not present

## 2019-12-16 DIAGNOSIS — R197 Diarrhea, unspecified: Secondary | ICD-10-CM | POA: Diagnosis not present

## 2019-12-16 DIAGNOSIS — I252 Old myocardial infarction: Secondary | ICD-10-CM | POA: Insufficient documentation

## 2019-12-16 HISTORY — PX: POLYPECTOMY: SHX5525

## 2019-12-16 HISTORY — PX: BIOPSY: SHX5522

## 2019-12-16 HISTORY — PX: COLONOSCOPY WITH PROPOFOL: SHX5780

## 2019-12-16 LAB — TISSUE TRANSGLUTAMINASE, IGA: Transglutaminase IgA: <2 U/mL (ref 0–3)

## 2019-12-16 LAB — IGA: IgA/Immunoglobulin A, Serum: 289 mg/dL (ref 90–386)

## 2019-12-16 LAB — GLUCOSE, CAPILLARY: Glucose-Capillary: 118 mg/dL — ABNORMAL HIGH (ref 70–99)

## 2019-12-16 SURGERY — COLONOSCOPY WITH PROPOFOL
Anesthesia: General

## 2019-12-16 MED ORDER — PROPOFOL 10 MG/ML IV BOLUS
INTRAVENOUS | Status: DC | PRN
  Administered 2019-12-16: 20 mg via INTRAVENOUS
  Administered 2019-12-16 (×2): 50 mg via INTRAVENOUS
  Administered 2019-12-16: 20 mg via INTRAVENOUS

## 2019-12-16 MED ORDER — KETAMINE HCL 50 MG/5ML IJ SOSY
PREFILLED_SYRINGE | INTRAMUSCULAR | Status: AC
  Filled 2019-12-16: qty 5

## 2019-12-16 MED ORDER — KETAMINE HCL 10 MG/ML IJ SOLN
INTRAMUSCULAR | Status: DC | PRN
  Administered 2019-12-16: 20 mg via INTRAVENOUS

## 2019-12-16 MED ORDER — PROPOFOL 500 MG/50ML IV EMUL
INTRAVENOUS | Status: DC | PRN
  Administered 2019-12-16: 150 ug/kg/min via INTRAVENOUS

## 2019-12-16 MED ORDER — LACTATED RINGERS IV SOLN
INTRAVENOUS | Status: DC
  Administered 2019-12-16: 1000 mL via INTRAVENOUS

## 2019-12-16 MED ORDER — CHLORHEXIDINE GLUCONATE CLOTH 2 % EX PADS: 6.0000 | MEDICATED_PAD | Freq: Once | CUTANEOUS | Status: AC

## 2019-12-16 MED ORDER — GLYCOPYRROLATE PF 0.2 MG/ML IJ SOSY
PREFILLED_SYRINGE | INTRAMUSCULAR | Status: AC
  Filled 2019-12-16: qty 3

## 2019-12-16 MED ORDER — GLYCOPYRROLATE 0.2 MG/ML IJ SOLN
INTRAMUSCULAR | Status: DC | PRN
  Administered 2019-12-16: .2 mg via INTRAVENOUS

## 2019-12-16 MED ORDER — LIDOCAINE 2% (20 MG/ML) 5 ML SYRINGE
INTRAMUSCULAR | Status: AC
  Filled 2019-12-16: qty 5

## 2019-12-16 NOTE — Discharge Instructions (Signed)
Colonoscopy Discharge Instructions  Read the instructions outlined below and refer to this sheet in the next few weeks. These discharge instructions provide you with general information on caring for yourself after you leave the hospital. Your doctor may also give you specific instructions. While your treatment has been planned according to the most current medical practices available, unavoidable complications occasionally occur. If you have any problems or questions after discharge, call Dr. Gala Romney at [phone]. ACTIVITY  You may resume your regular activity, but move at a slower pace for the next 24 hours.   Take frequent rest periods for the next 24 hours.   Walking will help get rid of the air and reduce the bloated feeling in your belly (abdomen).   No driving for 24 hours (because of the medicine (anesthesia) used during the test).    Do not sign any important legal documents or operate any machinery for 24 hours (because of the anesthesia used during the test).  NUTRITION  Drink plenty of fluids.   You may resume your normal diet as instructed by your doctor.   Begin with a light meal and progress to your normal diet. Heavy or fried foods are harder to digest and may make you feel sick to your stomach (nauseated).   Avoid alcoholic beverages for 24 hours or as instructed.  MEDICATIONS  You may resume your normal medications unless your doctor tells you otherwise.  WHAT YOU CAN EXPECT TODAY  Some feelings of bloating in the abdomen.   Passage of more gas than usual.   Spotting of blood in your stool or on the toilet paper.  IF YOU HAD POLYPS REMOVED DURING THE COLONOSCOPY:  No aspirin products for 7 days or as instructed.   No alcohol for 7 days or as instructed.   Eat a soft diet for the next 24 hours.  FINDING OUT THE RESULTS OF YOUR TEST Not all test results are available during your visit. If your test results are not back during the visit, make an appointment  with your caregiver to find out the results. Do not assume everything is normal if you have not heard from your caregiver or the medical facility. It is important for you to follow up on all of your test results.  SEEK IMMEDIATE MEDICAL ATTENTION IF:  You have more than a spotting of blood in your stool.   Your belly is swollen (abdominal distention).   You are nauseated or vomiting.   You have a temperature over 101.   You have abdominal pain or discomfort that is severe or gets worse throughout the day.     Colon Polyps  Polyps are tissue growths inside the body. Polyps can grow in many places, including the large intestine (colon). A polyp may be a round bump or a mushroom-shaped growth. You could have one polyp or several. Most colon polyps are noncancerous (benign). However, some colon polyps can become cancerous over time. Finding and removing the polyps early can help prevent this. What are the causes? The exact cause of colon polyps is not known. What increases the risk? You are more likely to develop this condition if you:  Have a family history of colon cancer or colon polyps.  Are older than 17 or older than 45 if you are African American.  Have inflammatory bowel disease, such as ulcerative colitis or Crohn's disease.  Have certain hereditary conditions, such as: ? Familial adenomatous polyposis. ? Lynch syndrome. ? Turcot syndrome. ? Peutz-Jeghers syndrome.  Are overweight.  Smoke cigarettes.  Do not get enough exercise.  Drink too much alcohol.  Eat a diet that is high in fat and red meat and low in fiber.  Had childhood cancer that was treated with abdominal radiation. What are the signs or symptoms? Most polyps do not cause symptoms. If you have symptoms, they may include:  Blood coming from your rectum when having a bowel movement.  Blood in your stool. The stool may look dark red or black.  Abdominal pain.  A change in bowel habits, such as  constipation or diarrhea. How is this diagnosed? This condition is diagnosed with a colonoscopy. This is a procedure in which a lighted, flexible scope is inserted into the anus and then passed into the colon to examine the area. Polyps are sometimes found when a colonoscopy is done as part of routine cancer screening tests. How is this treated? Treatment for this condition involves removing any polyps that are found. Most polyps can be removed during a colonoscopy. Those polyps will then be tested for cancer. Additional treatment may be needed depending on the results of testing. Follow these instructions at home: Lifestyle  Maintain a healthy weight, or lose weight if recommended by your health care provider.  Exercise every day or as told by your health care provider.  Do not use any products that contain nicotine or tobacco, such as cigarettes and e-cigarettes. If you need help quitting, ask your health care provider.  If you drink alcohol, limit how much you have: ? 0-1 drink a day for women. ? 0-2 drinks a day for men.  Be aware of how much alcohol is in your drink. In the [place] one drink equals one 12 oz bottle of beer (355 mL), one 5 oz glass of wine (148 mL), or one 1 oz shot of hard liquor (44 mL). Eating and drinking   Eat foods that are high in fiber, such as fruits, vegetables, and whole grains.  Eat foods that are high in calcium and vitamin D, such as milk, cheese, yogurt, eggs, liver, fish, and broccoli.  Limit foods that are high in fat, such as fried foods and desserts.  Limit the amount of red meat and processed meat you eat, such as hot dogs, sausage, bacon, and lunch meats. General instructions  Keep all follow-up visits as told by your health care provider. This is important. ? This includes having regularly scheduled colonoscopies. ? Talk to your health care provider about when you need a colonoscopy. Contact a health care provider if:  You have new or  worsening bleeding during a bowel movement.  You have new or increased blood in your stool.  You have a change in bowel habits.  You lose weight for no known reason. Summary  Polyps are tissue growths inside the body. Polyps can grow in many places, including the colon.  Most colon polyps are noncancerous (benign), but some can become cancerous over time.  This condition is diagnosed with a colonoscopy.  Treatment for this condition involves removing any polyps that are found. Most polyps can be removed during a colonoscopy. This information is not intended to replace advice given to you by your health care provider. Make sure you discuss any questions you have with your health care provider. Document Revised: 11/27/2017 Document Reviewed: 11/27/2017 Elsevier Patient Education  [place].   Colon polyp information provided  Further recommendations to follow pending review of pathology report  Office visit with [place] in  6 weeks  At patient request, I called Ardeen Garland, wife, [phone] -no contact.

## 2019-12-16 NOTE — Transfer of Care (Signed)
Immediate Anesthesia Transfer of Care Note  Patient: Matthew Stewart  Procedure(s) Performed: COLONOSCOPY WITH PROPOFOL (N/A ) BIOPSY POLYPECTOMY  Patient Location: PACU  Anesthesia Type:General  Level of Consciousness: awake  Airway & Oxygen Therapy: Patient Spontanous Breathing  Post-op Assessment: Report given to RN  Post vital signs: Reviewed and stable  Last Vitals:  Vitals Value Taken Time  BP 98/66 12/16/19 1402  Temp    Pulse 88 12/16/19 1405  Resp 21 12/16/19 1405  SpO2 95 % 12/16/19 1405  Vitals shown include unvalidated device data.  Last Pain:  Vitals:   12/16/19 1235  PainSc: 0-No pain      Patients Stated Pain Goal: 9 ([id number])  Complications: No apparent anesthesia complications

## 2019-12-16 NOTE — Anesthesia Preprocedure Evaluation (Signed)
Anesthesia Evaluation  Patient identified by MRN, date of birth, ID band Patient awake    Reviewed: Allergy & Precautions, H&P , NPO status , Patient's Chart, lab work & pertinent test results, reviewed documented beta blocker date and time   Airway Mallampati: II  TM Distance: >3 FB Neck ROM: full    Dental no notable dental hx. (+) Edentulous Upper, Edentulous Lower   Pulmonary shortness of breath, sleep apnea , pneumonia, resolved, COPD, Current Smoker and Patient abstained from smoking.,    Pulmonary exam normal breath sounds clear to auscultation       Cardiovascular Exercise Tolerance: Good hypertension, + CAD, + Past MI and + Peripheral Vascular Disease  Atrial Fibrillation  Rhythm:regular Rate:Normal     Neuro/Psych  Neuromuscular disease CVA, No Residual Symptoms negative psych ROS   GI/Hepatic Neg liver ROS, GERD  Medicated,  Endo/Other  negative endocrine ROSdiabetes  Renal/GU negative Renal ROS  negative genitourinary   Musculoskeletal   Abdominal   Peds  Hematology negative hematology ROS (+)   Anesthesia Other Findings   Reproductive/Obstetrics negative OB ROS                             Anesthesia Physical Anesthesia Plan  ASA: III  Anesthesia Plan: General   Post-op Pain Management:    Induction:   PONV Risk Score and Plan: 1 and Propofol infusion  Airway Management Planned:   Additional Equipment:   Intra-op Plan:   Post-operative Plan:   Informed Consent: I have reviewed the patients History and Physical, chart, labs and discussed the procedure including the risks, benefits and alternatives for the proposed anesthesia with the patient or authorized representative who has indicated his/her understanding and acceptance.     Dental Advisory Given  Plan Discussed with: CRNA  Anesthesia Plan Comments:         Anesthesia Quick Evaluation
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
## 2019-12-16 NOTE — Op Note (Signed)
[facility] Patient Name: Matthew Stewart Procedure Date: 12/16/2019 1:18 PM MRN: [id number] Date of Birth: Jun 04, 1961 Attending MD: Norvel Richards , MD CSN: [id number] Age: 59 Admit Type: Outpatient Procedure:                Colonoscopy Indications:              Chronic diarrhea Providers:                Norvel Richards, MD, Janeece Riggers, RN, Nelma Rothman, Technician Referring MD:             Fransisca Kaufmann. Dettinger Medicines:                Propofol per Anesthesia Complications:            No immediate complications. Estimated Blood Loss:     Estimated blood loss was minimal. Procedure:                After obtaining informed consent, the colonoscope                            was passed under direct vision. Throughout the                            procedure, the patient's blood pressure, pulse, and                            oxygen saturations were monitored continuously. The                            [id number] scope was introduced through                            the anus and advanced to the 5 cm into the ileum.                            The colonoscopy was performed without difficulty.                            The patient tolerated the procedure well. Scope In: 1:38:51 PM Scope Out: 1:54:02 PM Scope Withdrawal Time: 0 hours 10 minutes 35 seconds  Total Procedure Duration: 0 hours 15 minutes 11 seconds  Findings:      The perianal and digital rectal examinations were normal.      A 5 mm polyp was found in the splenic flexure. The polyp was sessile.       The polyp was removed with a cold snare. Resection and retrieval were       complete. Estimated blood loss was minimal.      The exam was otherwise without abnormality on direct and retroflexion       views. Segmental biopsies of the right and left colon taken for       histology. Distal 5 cm of TI appeared normal. Impression:               - One 5 mm polyp at the  splenic  flexure, removed                            with a cold snare. Resected and retrieved.                           - The examination was otherwise normal on direct                            and retroflexion views. Normal-appearing terminal                            ileum. Status post segmental colonic biopsy Moderate Sedation:      Moderate (conscious) sedation was personally administered by an       anesthesia professional. The following parameters were monitored: oxygen       saturation, heart rate, blood pressure, respiratory rate, EKG, adequacy       of pulmonary ventilation, and response to care. Recommendation:           - Patient has a contact number available for                            emergencies. The signs and symptoms of potential                            delayed complications were discussed with the                            patient. Return to normal activities tomorrow.                            Written discharge instructions were provided to the                            patient.                           - Advance diet as tolerated. Follow-up on                            pathology. Office visit with [place] in 6 weeks. Procedure Code(s):        --- Professional ---                           [phone], Colonoscopy, flexible; with removal of                            tumor(s), polyp(s), or other lesion(s) by snare                            technique Diagnosis Code(s):        --- Professional ---                           K63.5, Polyp of colon  K52.9, Noninfective gastroenteritis and colitis,                            unspecified CPT copyright 2019 American Medical Association. All rights reserved. The codes documented in this report are preliminary and upon coder review may  be revised to meet current compliance requirements. Cristopher Estimable. Cynthia Stainback, MD Norvel Richards, MD 12/16/2019 2:03:04 PM This report has been signed electronically. Number  of Addenda: 0

## 2019-12-16 NOTE — Anesthesia Postprocedure Evaluation (Signed)
Anesthesia Post Note  Patient: Christianjames Minser  Procedure(s) Performed: COLONOSCOPY WITH PROPOFOL (N/A ) BIOPSY POLYPECTOMY  Patient location during evaluation: PACU Anesthesia Type: General Level of consciousness: awake and awake and alert Pain management: pain level controlled Vital Signs Assessment: post-procedure vital signs reviewed and stable Respiratory status: spontaneous breathing Cardiovascular status: blood pressure returned to baseline and stable Postop Assessment: no apparent nausea or vomiting Anesthetic complications: no     Last Vitals:  Vitals:   12/16/19 1235  BP: 131/81  Pulse: 86  Resp: (!) 22  Temp: 36.9 C  SpO2: 96%    Last Pain:  Vitals:   12/16/19 1235  PainSc: 0-No pain                 Laquinn Shippy

## 2019-12-17 ENCOUNTER — Telehealth: Payer: Self-pay | Admitting: Family Medicine

## 2019-12-17 NOTE — Telephone Encounter (Signed)
  Prescription Request  12/17/2019  What is the name of the medication or equipment? OZEMPIC, wife called and said pt is out of meds. He has not taking in 2 days. She said the hold up is with insurance  Have you contacted your pharmacy to request a refill? (if applicable) YES  Which pharmacy would you like this sent to? [facility]   Patient notified that their request is being sent to the clinical staff for review and that they should receive a response within 2 business days.

## 2019-12-17 NOTE — Telephone Encounter (Signed)
Left samples in Fridge left patient message to call back

## 2019-12-20 ENCOUNTER — Encounter: Payer: Self-pay | Admitting: Internal Medicine

## 2019-12-20 LAB — SURGICAL PATHOLOGY

## 2019-12-20 NOTE — Telephone Encounter (Signed)
Phone call from Stu at [facility] needs PA for Ozempic 0.5mg  /dose.  Pt has Medicaid  He MUST try and fail 2 of the 3 preferred meds in this class before they will cover Ozempic   The 3 preferred are:  Victoza Byetta Bydureon ( he has tried and failed this one)   Must try one of the other 2  Must fail 2 of the 3   Please address and forward back to PA pool. He was given samples on Friday 4/23 - ok to hold for DETT on Tuesday

## 2019-12-22 ENCOUNTER — Telehealth: Payer: Self-pay | Admitting: Internal Medicine

## 2019-12-22 MED ORDER — DICYCLOMINE HCL 10 MG PO CAPS: ORAL_CAPSULE | ORAL | 1 refills | Status: DC

## 2019-12-22 NOTE — Addendum Note (Signed)
Addended by: Mahala Menghini on: 12/22/2019 01:17 PM   Modules accepted: Orders

## 2019-12-22 NOTE — Telephone Encounter (Signed)
Spoke with pts spouse. She is aware that letter of results were mailed this week. I discussed the results with pts spouse. Pts spouse would like to know if there is anything pt can do for his diarrhea/consiptation, cramping and digestion. Pt has had normal BM after his procedure. After eating anything, pt will get abdominal cramping.  Reviewed [place] with pts spouse. Pt is taking Pantoprazole 40 mg daily and no otc anti diarrhea meds. Pt is afraid to take anti diarrhea meds due to feeling like they will constipate him.

## 2019-12-22 NOTE — Telephone Encounter (Signed)
Spoke with pt. Pt notified of LSL recommendations. Pt will start taking Pantoprazole 40 mg bid and add Bentyl 10 mg 30 mins before mids tid. Pt will call back if needed.

## 2019-12-22 NOTE — Telephone Encounter (Signed)
Pt had procedure on 4/22 and his wife was calling to see if the results were available. [phone] or [phone]

## 2019-12-22 NOTE — Telephone Encounter (Signed)
Lmtcb.

## 2019-12-22 NOTE — Telephone Encounter (Signed)
I do not remember if he had a bad reaction to the Bydureon, the Byetta is twice a day and the Victoza is once daily and I do not know if you would be willing to switch to Victoza from a once weekly.  I think we need to have a conversation with him and see if he had a bad reaction to Bydureon and if not we can send the Bydureon and if he did have a bad reaction then ask him if he is okay with a once a day injection like Victoza.  Make sure he let them know it is insurance coverage the reason that we are looking at the change.

## 2019-12-22 NOTE — Telephone Encounter (Signed)
For reflux: have him take pantoprazole 40mg  twice daily every day. He was taking once daily and taking second dose only when needed. He should take one before bf and one before supper.   For bowels: let's try him on bentyl 10mg  30 minutes before meals (TID). RX sent to pharmacy.   Keep upcoming ov but call sooner if ongoing issues.

## 2019-12-23 ENCOUNTER — Telehealth: Payer: Self-pay | Admitting: Emergency Medicine

## 2019-12-23 NOTE — Telephone Encounter (Signed)
Spoke with pt wife Made appt for in office next week with labs = no A1C is over a year!  With the Bydureon - the reaction was: GI upset, stomach cramping and weight loss. He tried it for about 6 weeks ( that was a year ago)   We can re-eval is labs and make a med change decision on the day of appt (reminder in appt notes)   FYI

## 2019-12-23 NOTE — Telephone Encounter (Signed)
Pt called and stated they did receive the bentyl that was sent into [facility]. However, they did not get the rx for  For reflux:  pantoprazole 40mg  twice daily every day. He was taking once daily and taking second dose only when needed. Please sent rx into . [facility]

## 2019-12-24 MED ORDER — PANTOPRAZOLE SODIUM 40 MG PO TBEC: 40.0000 mg | DELAYED_RELEASE_TABLET | Freq: Two times a day (BID) | ORAL | 5 refills | Status: DC

## 2019-12-24 NOTE — Addendum Note (Signed)
Addended by: Mahala Menghini on: 12/24/2019 09:20 AM   Modules accepted: Orders

## 2019-12-24 NOTE — Telephone Encounter (Signed)
Pantoprazole rx done.

## 2019-12-24 NOTE — Telephone Encounter (Signed)
Pt notifed.

## 2019-12-31 ENCOUNTER — Ambulatory Visit: Payer: Medicaid Other | Admitting: Family Medicine

## 2020-01-03 ENCOUNTER — Ambulatory Visit: Payer: Medicaid Other | Admitting: Family Medicine

## 2020-01-03 ENCOUNTER — Encounter: Payer: Self-pay | Admitting: Family Medicine

## 2020-01-03 ENCOUNTER — Other Ambulatory Visit: Payer: Self-pay

## 2020-01-03 VITALS — BP 109/67 | HR 70 | Temp 97.6°F | Ht 77.0 in | Wt 243.0 lb

## 2020-01-03 DIAGNOSIS — E1169 Type 2 diabetes mellitus with other specified complication: Secondary | ICD-10-CM

## 2020-01-03 DIAGNOSIS — E1159 Type 2 diabetes mellitus with other circulatory complications: Secondary | ICD-10-CM | POA: Diagnosis not present

## 2020-01-03 DIAGNOSIS — I152 Hypertension secondary to endocrine disorders: Secondary | ICD-10-CM

## 2020-01-03 DIAGNOSIS — E669 Obesity, unspecified: Secondary | ICD-10-CM

## 2020-01-03 DIAGNOSIS — E785 Hyperlipidemia, unspecified: Secondary | ICD-10-CM

## 2020-01-03 DIAGNOSIS — K2100 Gastro-esophageal reflux disease with esophagitis, without bleeding: Secondary | ICD-10-CM

## 2020-01-03 DIAGNOSIS — I10 Essential (primary) hypertension: Secondary | ICD-10-CM

## 2020-01-03 DIAGNOSIS — J449 Chronic obstructive pulmonary disease, unspecified: Secondary | ICD-10-CM

## 2020-01-03 LAB — BAYER DCA HB A1C WAIVED: HB A1C (BAYER DCA - WAIVED): 6.5 % (ref <7.0)

## 2020-01-03 MED ORDER — VICTOZA 18 MG/3ML ~~LOC~~ SOPN: PEN_INJECTOR | SUBCUTANEOUS | 0 refills | Status: DC

## 2020-01-03 MED ORDER — VICTOZA 18 MG/3ML ~~LOC~~ SOPN: 1.8000 mg | PEN_INJECTOR | Freq: Every day | SUBCUTANEOUS | 2 refills | Status: DC

## 2020-01-03 NOTE — Progress Notes (Signed)
BP 109/67   Pulse 70   Temp 97.6 F (36.4 C)   Ht '6\' 5"'  (1.956 m)   Wt 243 lb (110.2 kg)   SpO2 95%   BMI 28.82 kg/m    Subjective:   Patient ID: Matthew Stewart, male    DOB: Mar 09, 1961, 59 y.o.   MRN: [id number]  HPI: Matthew Stewart is a 59 y.o. male presenting on 01/03/2020 for Medical Management of Chronic Issues and Diabetes   HPI Type 2 diabetes mellitus Patient comes in today for recheck of his diabetes. Patient has been currently taking Ozempic, had previously tried and failed Bydureon, A1c is 6.5. Patient is not currently on an ACE inhibitor/ARB. Patient has not seen an ophthalmologist this year. Patient denies any issues with their feet. The symptom started onset as an adult hypertension and cholesterol ARE RELATED TO DM   Hypertension Patient is currently on no medication currently has been diet controlled, and their blood pressure today is 109/67. Patient denies any lightheadedness or dizziness. Patient denies headaches, blurred vision, chest pains, shortness of breath, or weakness. Denies any side effects from medication and is content with current medication.   Hyperlipidemia Patient is coming in for recheck of his hyperlipidemia. The patient is currently taking no medication currently, right now we are trying to get diabetes medicine covered. They deny any issues with myalgias or history of liver damage from it. They deny any focal numbness or weakness or chest pain.   COPD Patient is coming in for COPD recheck today.  He is currently on albuterol as needed.  He has a mild chronic cough but denies any major coughing spells or wheezing spells.  He has 1nighttime symptoms per week and 0daytime symptoms per week currently.    Relevant past medical, surgical, family and social history reviewed and updated as indicated. Interim medical history since our last visit reviewed. Allergies and medications reviewed and updated.  Review of Systems  Constitutional: Negative for  chills and fever.  Respiratory: Negative for shortness of breath and wheezing.   Cardiovascular: Negative for chest pain and leg swelling.  Musculoskeletal: Negative for back pain and gait problem.  Skin: Negative for rash.  Neurological: Negative for dizziness, weakness and light-headedness.  All other systems reviewed and are negative.   Per HPI unless specifically indicated above   Allergies as of 01/03/2020      Reactions   Methocarbamol Nausea Only   Breo Ellipta [fluticasone Furoate-vilanterol] Rash   Caused rash around mouth   Spiriva Handihaler [tiotropium Bromide Monohydrate] Rash      Medication List       Accurate as of Jan 03, 2020  2:44 PM. If you have any questions, ask your nurse or doctor.        STOP taking these medications   dicyclomine 10 MG capsule Commonly known as: BENTYL Stopped by: Worthy Rancher, MD   Salonpas Pain Relief Patch Ptch Stopped by: Fransisca Kaufmann Lourdes Kucharski, MD     TAKE these medications   acetaminophen 500 MG tablet Commonly known as: TYLENOL Take 1,000 mg by mouth every 8 (eight) hours as needed for moderate pain or headache.   albuterol 108 (90 Base) MCG/ACT inhaler Commonly known as: VENTOLIN HFA Inhale 2 puffs into the lungs every 6 (six) hours as needed for wheezing or shortness of breath.   aspirin EC 81 MG tablet Take 81 mg by mouth daily.   diphenhydrAMINE 25 MG tablet Commonly known as: SOMINEX Take 25 mg by mouth  in the morning.   famotidine 20 MG tablet Commonly known as: PEPCID Take 20 mg by mouth See admin instructions. Take 20 mg daily, may take a second 20 mg dose as needed for heartburn   ibuprofen 200 MG tablet Commonly known as: ADVIL Take 400 mg by mouth daily as needed for moderate pain.   Ozempic (0.25 or 0.5 MG/DOSE) 2 MG/1.5ML Sopn Generic drug: Semaglutide(0.25 or 0.5MG/DOS) Inject 0.5 mg into the skin once a week. (Needs to be seen before next refill)   pantoprazole 40 MG tablet Commonly  known as: PROTONIX Take 1 tablet (40 mg total) by mouth 2 (two) times daily before a meal.   Victoza 18 MG/3ML Sopn Generic drug: liraglutide Start 0.36m SQ once a day for 7 days, then increase to 1.275monce a day Started by: JoWorthy RancherMD   Victoza 18 MG/3ML Sopn Generic drug: liraglutide Inject 0.3 mLs (1.8 mg total) into the skin daily. Started by: JoFransisca Kaufmannettinger, MD        Objective:   BP 109/67   Pulse 70   Temp 97.6 F (36.4 C)   Ht '6\' 5"'  (1.956 m)   Wt 243 lb (110.2 kg)   SpO2 95%   BMI 28.82 kg/m   Wt Readings from Last 3 Encounters:  01/03/20 243 lb (110.2 kg)  12/16/19 238 lb (108 kg)  12/14/19 238 lb (108 kg)    Physical Exam Vitals and nursing note reviewed.  Constitutional:      General: He is not in acute distress.    Appearance: He is well-developed. He is not diaphoretic.  Eyes:     General: No scleral icterus.    Conjunctiva/sclera: Conjunctivae normal.  Neck:     Thyroid: No thyromegaly.  Cardiovascular:     Rate and Rhythm: Normal rate and regular rhythm.     Heart sounds: Normal heart sounds. No murmur.  Pulmonary:     Effort: Pulmonary effort is normal. No respiratory distress.     Breath sounds: Normal breath sounds. No wheezing.  Musculoskeletal:        General: Normal range of motion.     Cervical back: Neck supple.  Lymphadenopathy:     Cervical: No cervical adenopathy.  Skin:    General: Skin is warm and dry.     Findings: No rash.  Neurological:     Mental Status: He is alert and oriented to person, place, and time.     Coordination: Coordination normal.  Psychiatric:        Behavior: Behavior normal.       Assessment & Plan:   Problem List Items Addressed This Visit      Cardiovascular and Mediastinum   Hypertension associated with diabetes ([place]  Relevant Medications   aspirin EC 81 MG tablet   liraglutide (VICTOZA) 18 MG/3ML SOPN   liraglutide (VICTOZA) 18 MG/3ML SOPN   Other Relevant Orders    CMP14+EGFR   Lipid panel     Respiratory   COPD (chronic obstructive pulmonary disease) with chronic bronchitis (HCC)     Digestive   GERD (gastroesophageal reflux disease)   Relevant Orders   CBC with Differential/Platelet     Endocrine   Diabetes mellitus type 2 in obese (HCC) - Primary   Relevant Medications   aspirin EC 81 MG tablet   liraglutide (VICTOZA) 18 MG/3ML SOPN   liraglutide (VICTOZA) 18 MG/3ML SOPN   Other Relevant Orders   Bayer DCA Hb A1c Waived  CMP14+EGFR   Lipid panel   Hyperlipidemia associated with type 2 diabetes mellitus (HCC)   Relevant Medications   aspirin EC 81 MG tablet   liraglutide (VICTOZA) 18 MG/3ML SOPN   liraglutide (VICTOZA) 18 MG/3ML SOPN   Other Relevant Orders   Lipid panel      Will give a dose of because Ozempic was not covered by his insurance, he will give it a try. Follow up plan: Return in about 3 months (around 04/04/2020), or if symptoms worsen or fail to improve, for Diabetes recheck.  Counseling provided for all of the vaccine components Orders Placed This Encounter  Procedures  . Bayer DCA Hb A1c Waived  . CBC with Differential/Platelet  . CMP14+EGFR  . Lipid panel    [name], MD [facility] 01/03/2020, 2:44 PM

## 2020-01-04 ENCOUNTER — Telehealth: Payer: Self-pay | Admitting: *Deleted

## 2020-01-04 ENCOUNTER — Encounter: Payer: Self-pay | Admitting: Family Medicine

## 2020-01-04 LAB — CMP14+EGFR
ALT: 18 IU/L (ref 0–44)
AST: 24 IU/L (ref 0–40)
Albumin/Globulin Ratio: 1.8 (ref 1.2–2.2)
Albumin: 3.7 g/dL — ABNORMAL LOW (ref 3.8–4.9)
Alkaline Phosphatase: 82 IU/L (ref 39–117)
BUN/Creatinine Ratio: 16 (ref 9–20)
BUN: 13 mg/dL (ref 6–24)
Bilirubin Total: 0.2 mg/dL (ref 0.0–1.2)
CO2: 26 mmol/L (ref 20–29)
Calcium: 8.8 mg/dL (ref 8.7–10.2)
Chloride: 101 mmol/L (ref 96–106)
Creatinine, Ser: 0.79 mg/dL (ref 0.76–1.27)
GFR calc Af Amer: 114 mL/min/{1.73_m2} (ref >59)
GFR calc non Af Amer: 99 mL/min/{1.73_m2} (ref >59)
Globulin, Total: 2.1 g/dL (ref 1.5–4.5)
Glucose: 156 mg/dL — ABNORMAL HIGH (ref 65–99)
Potassium: 3.8 mmol/L (ref 3.5–5.2)
Sodium: 140 mmol/L (ref 134–144)
Total Protein: 5.8 g/dL — ABNORMAL LOW (ref 6.0–8.5)

## 2020-01-04 LAB — CBC WITH DIFFERENTIAL/PLATELET
Basophils Absolute: 0.1 10*3/uL (ref 0.0–0.2)
Basos: 1 %
EOS (ABSOLUTE): 0.3 10*3/uL (ref 0.0–0.4)
Eos: 3 %
Hematocrit: 43.1 % (ref 37.5–51.0)
Hemoglobin: 14.4 g/dL (ref 13.0–17.7)
Immature Grans (Abs): 0 10*3/uL (ref 0.0–0.1)
Immature Granulocytes: 0 %
Lymphocytes Absolute: 2.2 10*3/uL (ref 0.7–3.1)
Lymphs: 18 %
MCH: 30.7 pg (ref 26.6–33.0)
MCHC: 33.4 g/dL (ref 31.5–35.7)
MCV: 92 fL (ref 79–97)
Monocytes Absolute: 0.8 10*3/uL (ref 0.1–0.9)
Monocytes: 7 %
Neutrophils Absolute: 8.7 10*3/uL — ABNORMAL HIGH (ref 1.4–7.0)
Neutrophils: 71 %
Platelets: 239 10*3/uL (ref 150–450)
RBC: 4.69 x10E6/uL (ref 4.14–5.80)
RDW: 12.5 % (ref 11.6–15.4)
WBC: 12.1 10*3/uL — ABNORMAL HIGH (ref 3.4–10.8)

## 2020-01-04 LAB — LIPID PANEL
Chol/HDL Ratio: 2.8 ratio (ref 0.0–5.0)
Cholesterol, Total: 135 mg/dL (ref 100–199)
HDL: 49 mg/dL (ref >39)
LDL Chol Calc (NIH): 74 mg/dL (ref 0–99)
Triglycerides: 53 mg/dL (ref 0–149)
VLDL Cholesterol Cal: 12 mg/dL (ref 5–40)

## 2020-01-04 NOTE — Telephone Encounter (Signed)
Prior Auth for [employer] Portal  Confirmation #- [id number]

## 2020-01-05 NOTE — Telephone Encounter (Signed)
PA approved effective 01/04/20-01/03/21. Pharmacy aware and detailed message left for patient.

## 2020-01-28 ENCOUNTER — Encounter: Payer: Self-pay | Admitting: Gastroenterology

## 2020-01-28 ENCOUNTER — Other Ambulatory Visit: Payer: Self-pay

## 2020-01-28 ENCOUNTER — Ambulatory Visit (INDEPENDENT_AMBULATORY_CARE_PROVIDER_SITE_OTHER): Payer: Medicaid Other | Admitting: Gastroenterology

## 2020-01-28 VITALS — BP 138/87 | HR 72 | Temp 97.3°F | Ht 76.0 in | Wt 235.0 lb

## 2020-01-28 DIAGNOSIS — R197 Diarrhea, unspecified: Secondary | ICD-10-CM

## 2020-01-28 NOTE — Patient Instructions (Signed)
1. I am glad you are feeling a lot better. Please call us if you have any questions or concerns.

## 2020-01-28 NOTE — Assessment & Plan Note (Signed)
Patient doing much better.  Ileocolonoscopy unremarkable with negative random colon biopsies.  Single tubular adenoma removed, plans for surveillance colonoscopy in 7 years.  He states all of his GI symptoms resolved and he is actually gained a few pounds in the past 4 weeks.  Was diagnosed with alpha gal and now avoiding pork, beef, all dairy.  It is not clear whether he actually had testing or there was suspicion of alpha gal and is doing food avoidance on his own.  Patient was unable to clarify.  I do not see any specific lab testing.  We discussed potentially considering allergy referral for monitoring, he will let me know if he decides this.  Otherwise we will see him back on an as-needed basis.

## 2020-01-28 NOTE — Progress Notes (Signed)
Primary Care Physician: Dettinger, Fransisca Kaufmann, MD  Primary Gastroenterologist:  Garfield Cornea, MD   Chief Complaint  Patient presents with  . Diarrhea    better    HPI: Matthew Stewart is a 59 y.o. male here for follow-up.  He was last seen in February 2021.  Over the past year he noted change in bowel habits with multiple loose stools.  Early satiety nausea, indigestion previously noted with certain foods, particularly meats.  At first he thought his symptoms were related to [facility] but symptoms did not resolve after stopping medication.  He had a EGD December 2020 showing moderately severe reflux esophagitis, Candida esophagitis, hiatal hernia.  He was treated with Diflucan for 21 days.  Pantoprazole was increased to twice daily.  In January 2020 weight 307 pounds.  In November 2020 weight 287 pounds.  Back in February he was down to 256 pounds.  At his lowest he states he got down to 229 pounds.  Celiac serologies were negative.  CT abdomen pelvis with contrast in February 2020, with mild hepatic steatosis, stable 9 mm left kidney stone.  Patient completed colonoscopy in April.  Normal terminal ileum, tubular adenoma removed from the colon.  Random colon biopsies were negative.  Since we last saw him he states he was diagnosed with alpha gal about 4 weeks ago.  He has quit all dairy, red meat, pork.  Consuming only fish and chicken.  He states he is 100% improved.  His diarrhea resolved.  No further upper GI symptoms.  His weight is up about 5 or 6 pounds.  Denies abdominal pain.  Heartburn continues to be well controlled.  No blood in the stool or melena.    Current Outpatient Medications  Medication Sig Dispense Refill  . acetaminophen (TYLENOL) 500 MG tablet Take 1,000 mg by mouth every 8 (eight) hours as needed for moderate pain or headache.    . albuterol (VENTOLIN HFA) 108 (90 Base) MCG/ACT inhaler Inhale 2 puffs into the lungs every 6 (six) hours as needed for wheezing or  shortness of breath. 18 g 5  . aspirin EC 81 MG tablet Take 81 mg by mouth daily.    . diphenhydrAMINE (SOMINEX) 25 MG tablet Take 25 mg by mouth in the morning.    . famotidine (PEPCID) 20 MG tablet Take 20 mg by mouth See admin instructions. Take 20 mg daily, may take a second 20 mg dose as needed for heartburn    . ibuprofen (ADVIL) 200 MG tablet Take 400 mg by mouth daily as needed for moderate pain.    Marland Kitchen liraglutide (VICTOZA) 18 MG/3ML SOPN Start 0.6mg  SQ once a day for 7 days, then increase to 1.2mg  once a day 2 pen 0  . pantoprazole (PROTONIX) 40 MG tablet Take 1 tablet (40 mg total) by mouth 2 (two) times daily before a meal. 60 tablet 5  . Semaglutide,0.25 or 0.5MG /DOS, (OZEMPIC, 0.25 OR 0.5 MG/DOSE,) 2 MG/1.5ML SOPN Inject 0.5 mg into the skin once a week. (Needs to be seen before next refill) 1.5 mL 0   No current facility-administered medications for this visit.    Allergies as of 01/28/2020 - Review Complete 01/28/2020  Allergen Reaction Noted  . Methocarbamol Nausea Only 12/08/2013  . Breo ellipta [fluticasone furoate-vilanterol] Rash 03/09/2018  . Spiriva handihaler [tiotropium bromide monohydrate] Rash 12/08/2013    ROS:  General: Negative for anorexia, weight loss, fever, chills, fatigue, weakness.  See HPI ENT: Negative for hoarseness, difficulty swallowing ,  nasal congestion. CV: Negative for chest pain, angina, palpitations, dyspnea on exertion, peripheral edema.  Respiratory: Negative for dyspnea at rest, dyspnea on exertion, cough, sputum, wheezing.  GI: See history of present illness. GU:  Negative for dysuria, hematuria, urinary incontinence, urinary frequency, nocturnal urination.  Endo: Negative for unusual weight change.  See HPI   Physical Examination:   BP 138/87   Pulse 72   Temp (!) 97.3 F (36.3 C) (Temporal)   Ht 6\' 4"  (1.93 m)   Wt 235 lb (106.6 kg)   BMI 28.61 kg/m   General: Well-nourished, well-developed in no acute distress.  Eyes: No  icterus. Mouth: masked Lungs: Clear to auscultation bilaterally.  Heart: Regular rate and rhythm, no murmurs rubs or gallops.  Abdomen: Bowel sounds are normal, nontender, nondistended, no hepatosplenomegaly or masses, no abdominal bruits or hernia , no rebound or guarding.   Extremities: No lower extremity edema. No clubbing or deformities. Neuro: Alert and oriented x 4   Skin: Warm and dry, no jaundice.   Psych: Alert and cooperative, normal mood and affect.  Labs:  Lab Results  Component Value Date   CREATININE 0.79 01/03/2020   BUN 13 01/03/2020   NA 140 01/03/2020   K 3.8 01/03/2020   CL 101 01/03/2020   CO2 26 01/03/2020   Lab Results  Component Value Date   WBC 12.1 (H) 01/03/2020   HGB 14.4 01/03/2020   HCT 43.1 01/03/2020   MCV 92 01/03/2020   PLT 239 01/03/2020   Lab Results  Component Value Date   ALT 18 01/03/2020   AST 24 01/03/2020   ALKPHOS 82 01/03/2020   BILITOT 0.2 01/03/2020       Imaging Studies: No results found.

## 2020-02-01 DIAGNOSIS — G959 Disease of spinal cord, unspecified: Secondary | ICD-10-CM | POA: Diagnosis not present

## 2020-02-09 ENCOUNTER — Other Ambulatory Visit: Payer: Self-pay | Admitting: Family Medicine

## 2020-02-14 ENCOUNTER — Other Ambulatory Visit: Payer: Self-pay | Admitting: Orthopedic Surgery

## 2020-02-14 DIAGNOSIS — M542 Cervicalgia: Secondary | ICD-10-CM

## 2020-02-14 DIAGNOSIS — G959 Disease of spinal cord, unspecified: Secondary | ICD-10-CM

## 2020-02-17 DIAGNOSIS — G959 Disease of spinal cord, unspecified: Secondary | ICD-10-CM | POA: Diagnosis not present

## 2020-02-17 DIAGNOSIS — M542 Cervicalgia: Secondary | ICD-10-CM | POA: Diagnosis not present

## 2020-02-25 ENCOUNTER — Telehealth: Payer: Self-pay | Admitting: Family Medicine

## 2020-02-25 NOTE — Telephone Encounter (Signed)
Pts wife called and stated that the pt stopped his Victoza 2 weeks ago due to making him sick. Pts blood sugar today is 223, blood pressure 96/67. Pt states that he is feeling tired and having vision issues to where he does not feel safe to drive. Spoke with Jan regarding these symptoms and she advised to have the pt to call 911. Gave this advice to pts wife and she verbalized understanding and stated they will call and have him checked out at the ER.

## 2020-02-25 NOTE — Telephone Encounter (Signed)
FYI for provider to review.

## 2020-02-26 DIAGNOSIS — R002 Palpitations: Secondary | ICD-10-CM | POA: Diagnosis not present

## 2020-03-16 ENCOUNTER — Other Ambulatory Visit: Payer: Self-pay

## 2020-03-16 ENCOUNTER — Ambulatory Visit
Admission: RE | Admit: 2020-03-16 | Discharge: 2020-03-16 | Disposition: A | Discharge status: Home / Self Care | Payer: Medicaid Other | Source: Ambulatory Visit | Attending: Orthopedic Surgery | Admitting: Orthopedic Surgery

## 2020-03-16 ENCOUNTER — Other Ambulatory Visit: Payer: Medicaid Other

## 2020-03-16 DIAGNOSIS — G959 Disease of spinal cord, unspecified: Secondary | ICD-10-CM

## 2020-03-16 DIAGNOSIS — M50222 Other cervical disc displacement at C5-C6 level: Secondary | ICD-10-CM | POA: Diagnosis not present

## 2020-03-16 DIAGNOSIS — M50221 Other cervical disc displacement at C4-C5 level: Secondary | ICD-10-CM | POA: Diagnosis not present

## 2020-03-16 DIAGNOSIS — M4802 Spinal stenosis, cervical region: Secondary | ICD-10-CM | POA: Diagnosis not present

## 2020-03-16 DIAGNOSIS — M47812 Spondylosis without myelopathy or radiculopathy, cervical region: Secondary | ICD-10-CM | POA: Diagnosis not present

## 2020-03-21 DIAGNOSIS — S93602A Unspecified sprain of left foot, initial encounter: Secondary | ICD-10-CM | POA: Diagnosis not present

## 2020-03-21 DIAGNOSIS — S92352A Displaced fracture of fifth metatarsal bone, left foot, initial encounter for closed fracture: Secondary | ICD-10-CM | POA: Diagnosis not present

## 2020-03-21 DIAGNOSIS — X501XXA Overexertion from prolonged static or awkward postures, initial encounter: Secondary | ICD-10-CM | POA: Diagnosis not present

## 2020-03-24 ENCOUNTER — Telehealth: Payer: Self-pay | Admitting: Family Medicine

## 2020-03-24 MED ORDER — OZEMPIC (0.25 OR 0.5 MG/DOSE) 2 MG/1.5ML ~~LOC~~ SOPN: PEN_INJECTOR | SUBCUTANEOUS | 1 refills | Status: AC

## 2020-03-24 NOTE — Telephone Encounter (Signed)
Pts wife states that the liraglutide ([place]) 74 MG/3ML SOPN Is upsetting her husbands stomach. She states Dr. Warrick Parisian stated if the medication did not work for him he would put him back on the ozempic. She is wanting to see if the change can be made. [employer].

## 2020-03-24 NOTE — Telephone Encounter (Signed)
Ozempic Prescription sent to pharmacy

## 2020-03-24 NOTE — Telephone Encounter (Signed)
Left detailed message on voicemail that rx requested was sent in to pharmacy. Advised to contact office with any questions

## 2020-04-03 DIAGNOSIS — M79672 Pain in left foot: Secondary | ICD-10-CM | POA: Diagnosis not present

## 2020-04-03 DIAGNOSIS — X501XXD Overexertion from prolonged static or awkward postures, subsequent encounter: Secondary | ICD-10-CM | POA: Diagnosis not present

## 2020-04-03 DIAGNOSIS — M19072 Primary osteoarthritis, left ankle and foot: Secondary | ICD-10-CM | POA: Diagnosis not present

## 2020-04-03 DIAGNOSIS — S92902D Unspecified fracture of left foot, subsequent encounter for fracture with routine healing: Secondary | ICD-10-CM | POA: Diagnosis not present

## 2020-04-03 DIAGNOSIS — M7732 Calcaneal spur, left foot: Secondary | ICD-10-CM | POA: Diagnosis not present

## 2020-05-22 DIAGNOSIS — M5032 Other cervical disc degeneration, mid-cervical region, unspecified level: Secondary | ICD-10-CM | POA: Diagnosis not present

## 2020-05-22 DIAGNOSIS — M4802 Spinal stenosis, cervical region: Secondary | ICD-10-CM | POA: Diagnosis not present

## 2020-05-22 DIAGNOSIS — G959 Disease of spinal cord, unspecified: Secondary | ICD-10-CM | POA: Diagnosis not present

## 2020-05-26 DIAGNOSIS — M542 Cervicalgia: Secondary | ICD-10-CM | POA: Diagnosis not present

## 2020-05-29 ENCOUNTER — Telehealth: Payer: Self-pay

## 2020-05-29 NOTE — Telephone Encounter (Signed)
Appointment scheduled.

## 2020-06-01 ENCOUNTER — Ambulatory Visit: Payer: Medicaid Other | Admitting: Family Medicine

## 2020-06-01 ENCOUNTER — Other Ambulatory Visit: Payer: Self-pay

## 2020-06-01 ENCOUNTER — Encounter: Payer: Self-pay | Admitting: Family Medicine

## 2020-06-01 VITALS — BP 123/77 | HR 76 | Temp 98.0°F | Ht 76.0 in | Wt 235.0 lb

## 2020-06-01 DIAGNOSIS — E1169 Type 2 diabetes mellitus with other specified complication: Secondary | ICD-10-CM

## 2020-06-01 DIAGNOSIS — S92352A Displaced fracture of fifth metatarsal bone, left foot, initial encounter for closed fracture: Secondary | ICD-10-CM

## 2020-06-01 DIAGNOSIS — E669 Obesity, unspecified: Secondary | ICD-10-CM

## 2020-06-01 LAB — BAYER DCA HB A1C WAIVED: HB A1C (BAYER DCA - WAIVED): 6.3 % (ref <7.0)

## 2020-06-01 NOTE — Progress Notes (Signed)
BP 123/77   Pulse 76   Temp 98 F (36.7 C)   Ht '6\' 4"'  (1.93 m)   Wt 235 lb (106.6 kg)   SpO2 97%   BMI 28.61 kg/m    Subjective:   Patient ID: Matthew Stewart, male    DOB: 1961-06-11, 59 y.o.   MRN: [id number]  HPI: Matthew Stewart is a 59 y.o. male presenting on 06/01/2020 for Foot Injury (left)   HPI Patient is coming in for ER follow-up for left foot fracture.  He was in the emergency department on 03/21/2020 for a closed displaced fracture of the fifth metatarsal, he slipped and rolled his foot and went back to the emergency department on 04/03/2020 but did not find any new fractures or recurrence of fracture at that point.  Patient was seen in [place] for both occasions.    Type 2 diabetes mellitus Patient comes in today for recheck of his diabetes. Patient has been currently taking Ozempic. Patient is not currently on an ACE inhibitor/ARB. Patient has not seen an ophthalmologist this year. Patient complains of swelling and pain in his left foot where he had a fracture on 03/21/2020. The symptom started onset as an adult hypertension and hyperlipidemia and CAD ARE RELATED TO DM   Relevant past medical, surgical, family and social history reviewed and updated as indicated. Interim medical history since our last visit reviewed. Allergies and medications reviewed and updated.  Review of Systems  Constitutional: Negative for chills and fever.  Respiratory: Negative for shortness of breath and wheezing.   Cardiovascular: Negative for chest pain and leg swelling.  Musculoskeletal: Positive for arthralgias and gait problem. Negative for back pain and myalgias.  Skin: Negative for rash.  Neurological: Negative for dizziness, weakness and light-headedness.  All other systems reviewed and are negative.   Per HPI unless specifically indicated above   Allergies as of 06/01/2020      Reactions   Methocarbamol Nausea Only   Breo Ellipta [fluticasone Furoate-vilanterol]  Rash   Caused rash around mouth   Spiriva Handihaler [tiotropium Bromide Monohydrate] Rash      Medication List       Accurate as of June 01, 2020  9:41 AM. If you have any questions, ask your nurse or doctor.        STOP taking these medications   acetaminophen 500 MG tablet Commonly known as: TYLENOL Stopped by: Worthy Rancher, MD   albuterol 108 (90 Base) MCG/ACT inhaler Commonly known as: VENTOLIN HFA Stopped by: Worthy Rancher, MD   aspirin EC 81 MG tablet Stopped by: Worthy Rancher, MD   diphenhydrAMINE 25 MG tablet Commonly known as: SOMINEX Stopped by: Worthy Rancher, MD   famotidine 20 MG tablet Commonly known as: PEPCID Stopped by: Worthy Rancher, MD   ibuprofen 200 MG tablet Commonly known as: ADVIL Stopped by: Fransisca Kaufmann Peri Kreft, MD   pantoprazole 40 MG tablet Commonly known as: PROTONIX Stopped by: Worthy Rancher, MD     TAKE these medications   HYDROcodone-acetaminophen 7.5-325 MG tablet Commonly known as: NORCO Take 1 tablet by mouth every 6 (six) hours as needed.   Ozempic (1 MG/DOSE) 2 MG/1.5ML Sopn Generic drug: Semaglutide (1 MG/DOSE) Inject into the skin once a week.        Objective:   BP 123/77   Pulse 76   Temp 98 F (36.7 C)   Ht '6\' 4"'  (1.93 m)   Wt 235 lb (106.6 kg)  SpO2 97%   BMI 28.61 kg/m   Wt Readings from Last 3 Encounters:  06/01/20 235 lb (106.6 kg)  01/28/20 235 lb (106.6 kg)  01/03/20 243 lb (110.2 kg)    Physical Exam Vitals and nursing note reviewed.  Constitutional:      General: He is not in acute distress.    Appearance: He is well-developed. He is not diaphoretic.  Eyes:     General: No scleral icterus.    Conjunctiva/sclera: Conjunctivae normal.  Neck:     Thyroid: No thyromegaly.  Cardiovascular:     Rate and Rhythm: Normal rate and regular rhythm.     Heart sounds: Normal heart sounds. No murmur heard.   Pulmonary:     Effort: Pulmonary effort is normal. No  respiratory distress.     Breath sounds: Normal breath sounds. No wheezing.  Musculoskeletal:     Cervical back: Neck supple.     Left foot: Normal range of motion. Swelling and tenderness present. No deformity.  Lymphadenopathy:     Cervical: No cervical adenopathy.  Skin:    General: Skin is warm and dry.     Findings: No rash.  Neurological:     Mental Status: He is alert and oriented to person, place, and time.     Coordination: Coordination normal.  Psychiatric:        Behavior: Behavior normal.       Assessment & Plan:   Problem List Items Addressed This Visit      Endocrine   Diabetes mellitus type 2 in obese ([place])   Relevant Medications   Semaglutide, 1 MG/DOSE, (OZEMPIC, 1 MG/DOSE,) 2 MG/1.5ML SOPN   Other Relevant Orders   Bayer DCA Hb A1c Waived    Other Visit Diagnoses    Closed displaced fracture of fifth metatarsal bone of left foot, initial encounter    -  Primary   Relevant Orders   CBC with Differential/Platelet   CMP14+EGFR   Ambulatory referral to Orthopedic Surgery      Will refer to orthopedic, appears that he is still having a lot of issues with his foot, want them to look at it. Follow up plan: Return in about 3 months (around 09/01/2020), or if symptoms worsen or fail to improve, for Diabetes follow-up.  Counseling provided for all of the vaccine components Orders Placed This Encounter  Procedures  . CBC with Differential/Platelet  . CMP14+EGFR  . Bayer DCA Hb A1c Waived  . Ambulatory referral to [facility] Madysen Faircloth, MD [facility] 06/01/2020, 9:41 AM

## 2020-06-02 DIAGNOSIS — M79672 Pain in left foot: Secondary | ICD-10-CM | POA: Diagnosis not present

## 2020-06-02 LAB — CBC WITH DIFFERENTIAL/PLATELET
Basophils Absolute: 0.1 10*3/uL (ref 0.0–0.2)
Basos: 1 %
EOS (ABSOLUTE): 0.4 10*3/uL (ref 0.0–0.4)
Eos: 5 %
Hematocrit: 44.8 % (ref 37.5–51.0)
Hemoglobin: 15 g/dL (ref 13.0–17.7)
Immature Grans (Abs): 0 10*3/uL (ref 0.0–0.1)
Immature Granulocytes: 0 %
Lymphocytes Absolute: 3.8 10*3/uL — ABNORMAL HIGH (ref 0.7–3.1)
Lymphs: 47 %
MCH: 30.8 pg (ref 26.6–33.0)
MCHC: 33.5 g/dL (ref 31.5–35.7)
MCV: 92 fL (ref 79–97)
Monocytes Absolute: 0.5 10*3/uL (ref 0.1–0.9)
Monocytes: 7 %
Neutrophils Absolute: 3.1 10*3/uL (ref 1.4–7.0)
Neutrophils: 40 %
Platelets: 299 10*3/uL (ref 150–450)
RBC: 4.87 x10E6/uL (ref 4.14–5.80)
RDW: 12.4 % (ref 11.6–15.4)
WBC: 7.9 10*3/uL (ref 3.4–10.8)

## 2020-06-02 LAB — CMP14+EGFR
ALT: 15 IU/L (ref 0–44)
AST: 13 IU/L (ref 0–40)
Albumin/Globulin Ratio: 1.6 (ref 1.2–2.2)
Albumin: 4.1 g/dL (ref 3.8–4.9)
Alkaline Phosphatase: 95 IU/L (ref 44–121)
BUN/Creatinine Ratio: 12 (ref 9–20)
BUN: 9 mg/dL (ref 6–24)
Bilirubin Total: <0.2 mg/dL (ref 0.0–1.2)
CO2: 24 mmol/L (ref 20–29)
Calcium: 9.6 mg/dL (ref 8.7–10.2)
Chloride: 99 mmol/L (ref 96–106)
Creatinine, Ser: 0.75 mg/dL — ABNORMAL LOW (ref 0.76–1.27)
GFR calc Af Amer: 117 mL/min/{1.73_m2} (ref >59)
GFR calc non Af Amer: 101 mL/min/{1.73_m2} (ref >59)
Globulin, Total: 2.5 g/dL (ref 1.5–4.5)
Glucose: 92 mg/dL (ref 65–99)
Potassium: 4.6 mmol/L (ref 3.5–5.2)
Sodium: 134 mmol/L (ref 134–144)
Total Protein: 6.6 g/dL (ref 6.0–8.5)

## 2020-06-05 DIAGNOSIS — X501XXA Overexertion from prolonged static or awkward postures, initial encounter: Secondary | ICD-10-CM | POA: Diagnosis not present

## 2020-06-05 DIAGNOSIS — S99922A Unspecified injury of left foot, initial encounter: Secondary | ICD-10-CM | POA: Diagnosis not present

## 2020-06-05 DIAGNOSIS — S93602A Unspecified sprain of left foot, initial encounter: Secondary | ICD-10-CM | POA: Diagnosis not present

## 2020-06-06 DIAGNOSIS — S99922A Unspecified injury of left foot, initial encounter: Secondary | ICD-10-CM | POA: Diagnosis not present

## 2020-06-08 ENCOUNTER — Telehealth: Payer: Self-pay

## 2020-06-08 NOTE — Telephone Encounter (Signed)
Appt scheduled

## 2020-06-12 ENCOUNTER — Ambulatory Visit: Payer: Medicaid Other | Admitting: Family Medicine

## 2020-06-14 ENCOUNTER — Encounter: Payer: Self-pay | Admitting: Family Medicine

## 2020-07-05 ENCOUNTER — Telehealth: Payer: Self-pay

## 2020-07-05 NOTE — Telephone Encounter (Signed)
Pt called to see if anyone has reviewed his MRI results. Said he dropped off a disc last week and was supposed to get a call to go over results but no one has called.  Please call pt once results are reviewed. [phone]

## 2020-07-05 NOTE — Telephone Encounter (Signed)
Waiting on response from [facility] - [place] aware

## 2020-07-05 NOTE — Telephone Encounter (Signed)
This was the disc that we sent to see if our radiologist will look at it? Do you know what happened? [name], MD [facility] 07/05/2020, 9:57 AM

## 2020-07-24 ENCOUNTER — Telehealth: Payer: Self-pay

## 2020-07-24 DIAGNOSIS — M542 Cervicalgia: Secondary | ICD-10-CM | POA: Diagnosis not present

## 2020-07-24 DIAGNOSIS — W1830XA Fall on same level, unspecified, initial encounter: Secondary | ICD-10-CM | POA: Diagnosis not present

## 2020-07-24 DIAGNOSIS — M5412 Radiculopathy, cervical region: Secondary | ICD-10-CM | POA: Diagnosis not present

## 2020-07-24 NOTE — Telephone Encounter (Signed)
Matthew Stewart, phone note about this on 07/05/20 states waiting on [facility].  Can you check into this please.

## 2020-07-24 NOTE — Telephone Encounter (Signed)
I never received an answer if this could be done - called [facility] and once images are uploaded Dr. Warrick Parisian can call and request a curbside consult for a radiologist to read images.    Patient's wife has been notified of the wait - images have been sent to [facility] for upload

## 2020-08-01 ENCOUNTER — Encounter: Payer: Self-pay | Admitting: *Deleted

## 2020-08-02 ENCOUNTER — Ambulatory Visit: Payer: Medicaid Other | Admitting: Diagnostic Neuroimaging

## 2020-08-02 ENCOUNTER — Encounter: Payer: Self-pay | Admitting: Diagnostic Neuroimaging

## 2020-08-02 ENCOUNTER — Other Ambulatory Visit: Payer: Self-pay

## 2020-08-02 VITALS — BP 139/83 | HR 91 | Ht 76.0 in | Wt 251.2 lb

## 2020-08-02 DIAGNOSIS — M5412 Radiculopathy, cervical region: Secondary | ICD-10-CM | POA: Diagnosis not present

## 2020-08-02 DIAGNOSIS — M545 Low back pain, unspecified: Secondary | ICD-10-CM

## 2020-08-02 DIAGNOSIS — E1142 Type 2 diabetes mellitus with diabetic polyneuropathy: Secondary | ICD-10-CM | POA: Diagnosis not present

## 2020-08-02 DIAGNOSIS — M542 Cervicalgia: Secondary | ICD-10-CM | POA: Diagnosis not present

## 2020-08-02 DIAGNOSIS — R269 Unspecified abnormalities of gait and mobility: Secondary | ICD-10-CM | POA: Diagnosis not present

## 2020-08-02 DIAGNOSIS — R200 Anesthesia of skin: Secondary | ICD-10-CM

## 2020-08-02 DIAGNOSIS — G8929 Other chronic pain: Secondary | ICD-10-CM
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
## 2020-08-03 ENCOUNTER — Encounter: Payer: Self-pay | Admitting: *Deleted

## 2020-08-03 LAB — VITAMIN B12: Vitamin B-12: 444 pg/mL (ref 232–1245)

## 2020-08-04 NOTE — Telephone Encounter (Signed)
Images has been uploaded - Dr. Warrick Parisian needs to call [facility] at [phone] and ask for a crubside reading

## 2020-08-05 ENCOUNTER — Encounter: Payer: Self-pay | Admitting: Diagnostic Neuroimaging

## 2020-08-07 ENCOUNTER — Telehealth: Payer: Self-pay | Admitting: Diagnostic Neuroimaging

## 2020-08-07 NOTE — Telephone Encounter (Signed)
mcd healthy blue pending

## 2020-08-09 NOTE — Telephone Encounter (Signed)
Checked the status on the protal it is still pending.

## 2020-08-15 NOTE — Telephone Encounter (Signed)
Checked the status it is still pending.

## 2020-08-21 NOTE — Telephone Encounter (Addendum)
Checked [facility] portal. Patient has been approved for MRI Brain. PA #[id number] (08/07/20- 09/06/20). I removed patient from [employer] portal dashboard. Documented in request notes. Sent to GI.

## 2020-08-26 NOTE — Telephone Encounter (Signed)
Noted, thank you

## 2020-08-30 ENCOUNTER — Ambulatory Visit: Payer: Medicaid Other | Admitting: Family Medicine

## 2020-09-04 ENCOUNTER — Encounter: Payer: Self-pay | Admitting: Family Medicine

## 2020-09-08 ENCOUNTER — Other Ambulatory Visit: Payer: Self-pay | Admitting: Family Medicine

## 2020-09-09 ENCOUNTER — Other Ambulatory Visit: Payer: Self-pay | Admitting: Family Medicine

## 2020-09-12 ENCOUNTER — Other Ambulatory Visit: Payer: Self-pay | Admitting: Family Medicine

## 2020-09-14 ENCOUNTER — Other Ambulatory Visit: Payer: Self-pay

## 2020-09-14 MED ORDER — OZEMPIC (1 MG/DOSE) 2 MG/1.5ML ~~LOC~~ SOPN: 1.0000 mg | PEN_INJECTOR | SUBCUTANEOUS | 3 refills | Status: DC

## 2020-09-23 DIAGNOSIS — M79672 Pain in left foot: Secondary | ICD-10-CM | POA: Diagnosis not present

## 2020-09-23 DIAGNOSIS — X501XXA Overexertion from prolonged static or awkward postures, initial encounter: Secondary | ICD-10-CM | POA: Diagnosis not present

## 2020-09-23 DIAGNOSIS — S92902A Unspecified fracture of left foot, initial encounter for closed fracture: Secondary | ICD-10-CM | POA: Diagnosis not present

## 2020-09-23 DIAGNOSIS — M795 Residual foreign body in soft tissue: Secondary | ICD-10-CM | POA: Diagnosis not present

## 2020-10-04 DIAGNOSIS — M79671 Pain in right foot: Secondary | ICD-10-CM | POA: Diagnosis not present

## 2020-10-04 DIAGNOSIS — S92335A Nondisplaced fracture of third metatarsal bone, left foot, initial encounter for closed fracture: Secondary | ICD-10-CM | POA: Diagnosis not present

## 2020-10-04 DIAGNOSIS — M79672 Pain in left foot: Secondary | ICD-10-CM | POA: Diagnosis not present

## 2020-10-04 DIAGNOSIS — M25572 Pain in left ankle and joints of left foot: Secondary | ICD-10-CM | POA: Diagnosis not present

## 2020-10-04 DIAGNOSIS — S92333A Displaced fracture of third metatarsal bone, unspecified foot, initial encounter for closed fracture: Secondary | ICD-10-CM | POA: Insufficient documentation

## 2020-10-04 DIAGNOSIS — M25571 Pain in right ankle and joints of right foot: Secondary | ICD-10-CM | POA: Diagnosis not present

## 2020-10-16 DIAGNOSIS — I10 Essential (primary) hypertension: Secondary | ICD-10-CM | POA: Diagnosis not present

## 2020-10-16 DIAGNOSIS — E119 Type 2 diabetes mellitus without complications: Secondary | ICD-10-CM | POA: Diagnosis not present

## 2020-11-02 DIAGNOSIS — M5412 Radiculopathy, cervical region: Secondary | ICD-10-CM | POA: Diagnosis not present

## 2020-11-14 ENCOUNTER — Other Ambulatory Visit: Payer: Self-pay | Admitting: Student

## 2020-11-14 DIAGNOSIS — M79672 Pain in left foot: Secondary | ICD-10-CM

## 2020-11-21 DIAGNOSIS — Z981 Arthrodesis status: Secondary | ICD-10-CM | POA: Diagnosis not present

## 2020-11-21 DIAGNOSIS — M5030 Other cervical disc degeneration, unspecified cervical region: Secondary | ICD-10-CM | POA: Diagnosis not present

## 2020-11-21 DIAGNOSIS — W19XXXD Unspecified fall, subsequent encounter: Secondary | ICD-10-CM | POA: Diagnosis not present

## 2020-11-21 DIAGNOSIS — M5136 Other intervertebral disc degeneration, lumbar region: Secondary | ICD-10-CM | POA: Diagnosis not present

## 2020-11-22 ENCOUNTER — Ambulatory Visit
Admission: RE | Admit: 2020-11-22 | Discharge: 2020-11-22 | Disposition: A | Discharge status: Home / Self Care | Payer: Medicaid Other | Source: Ambulatory Visit | Attending: Student | Admitting: Student

## 2020-11-22 ENCOUNTER — Other Ambulatory Visit: Payer: Self-pay

## 2020-11-22 DIAGNOSIS — G629 Polyneuropathy, unspecified: Secondary | ICD-10-CM | POA: Diagnosis not present

## 2020-11-22 DIAGNOSIS — M7989 Other specified soft tissue disorders: Secondary | ICD-10-CM | POA: Diagnosis not present

## 2020-11-22 DIAGNOSIS — M795 Residual foreign body in soft tissue: Secondary | ICD-10-CM | POA: Diagnosis not present

## 2020-11-22 DIAGNOSIS — I219 Acute myocardial infarction, unspecified: Secondary | ICD-10-CM | POA: Insufficient documentation

## 2020-11-22 DIAGNOSIS — R600 Localized edema: Secondary | ICD-10-CM | POA: Diagnosis not present

## 2020-11-22 DIAGNOSIS — M79672 Pain in left foot: Secondary | ICD-10-CM

## 2020-12-08 DIAGNOSIS — E1169 Type 2 diabetes mellitus with other specified complication: Secondary | ICD-10-CM | POA: Diagnosis not present

## 2020-12-08 DIAGNOSIS — M79672 Pain in left foot: Secondary | ICD-10-CM | POA: Diagnosis not present

## 2020-12-08 DIAGNOSIS — M14671 Charcot's joint, right ankle and foot: Secondary | ICD-10-CM | POA: Diagnosis not present

## 2020-12-22 DIAGNOSIS — M14671 Charcot's joint, right ankle and foot: Secondary | ICD-10-CM | POA: Diagnosis not present

## 2021-01-05 DIAGNOSIS — M14672 Charcot's joint, left ankle and foot: Secondary | ICD-10-CM | POA: Diagnosis not present

## 2021-01-12 DIAGNOSIS — M5136 Other intervertebral disc degeneration, lumbar region: Secondary | ICD-10-CM | POA: Diagnosis not present

## 2021-01-12 DIAGNOSIS — M47816 Spondylosis without myelopathy or radiculopathy, lumbar region: Secondary | ICD-10-CM | POA: Diagnosis not present

## 2021-01-12 DIAGNOSIS — M19012 Primary osteoarthritis, left shoulder: Secondary | ICD-10-CM | POA: Diagnosis not present

## 2021-01-12 DIAGNOSIS — M50322 Other cervical disc degeneration at C5-C6 level: Secondary | ICD-10-CM | POA: Diagnosis not present

## 2021-01-12 DIAGNOSIS — Z981 Arthrodesis status: Secondary | ICD-10-CM | POA: Diagnosis not present

## 2021-01-12 DIAGNOSIS — H538 Other visual disturbances: Secondary | ICD-10-CM | POA: Diagnosis not present

## 2021-01-12 DIAGNOSIS — M47812 Spondylosis without myelopathy or radiculopathy, cervical region: Secondary | ICD-10-CM | POA: Diagnosis not present

## 2021-01-12 DIAGNOSIS — M19011 Primary osteoarthritis, right shoulder: Secondary | ICD-10-CM | POA: Diagnosis not present

## 2021-01-29 ENCOUNTER — Telehealth: Payer: Self-pay | Admitting: Diagnostic Neuroimaging

## 2021-01-29 NOTE — Telephone Encounter (Signed)
Pt's wife Patrici Ranks on [place] called wanting to know if the MRI of the Brain referral can be put in again so that the pt can r/s for it at [facility]. Please advise.

## 2021-01-29 NOTE — Telephone Encounter (Signed)
Called [facility], spoke with Randell Patient who stated MRI brain order form Dec 2021 is still active. Patient can call and select option 1, then 3 to schedule it. Called wife, on [place] and informed her to call [facility] to schedule. Gave her #. She verbalized understanding, appreciation.

## 2021-02-04 ENCOUNTER — Other Ambulatory Visit: Payer: Medicaid Other

## 2021-02-11 DIAGNOSIS — M7732 Calcaneal spur, left foot: Secondary | ICD-10-CM | POA: Diagnosis not present

## 2021-02-11 DIAGNOSIS — M19072 Primary osteoarthritis, left ankle and foot: Secondary | ICD-10-CM | POA: Diagnosis not present

## 2021-02-11 DIAGNOSIS — S92332D Displaced fracture of third metatarsal bone, left foot, subsequent encounter for fracture with routine healing: Secondary | ICD-10-CM | POA: Diagnosis not present

## 2021-02-11 DIAGNOSIS — R0602 Shortness of breath: Secondary | ICD-10-CM | POA: Diagnosis not present

## 2021-02-14 DIAGNOSIS — E785 Hyperlipidemia, unspecified: Secondary | ICD-10-CM | POA: Diagnosis not present

## 2021-02-14 DIAGNOSIS — E119 Type 2 diabetes mellitus without complications: Secondary | ICD-10-CM | POA: Diagnosis not present

## 2021-02-14 DIAGNOSIS — J449 Chronic obstructive pulmonary disease, unspecified: Secondary | ICD-10-CM | POA: Diagnosis not present

## 2021-02-15 DIAGNOSIS — M14672 Charcot's joint, left ankle and foot: Secondary | ICD-10-CM | POA: Diagnosis not present

## 2021-02-19 DIAGNOSIS — I517 Cardiomegaly: Secondary | ICD-10-CM | POA: Diagnosis not present

## 2021-03-19 ENCOUNTER — Other Ambulatory Visit: Payer: Medicaid Other

## 2021-03-20 DIAGNOSIS — R0789 Other chest pain: Secondary | ICD-10-CM | POA: Diagnosis not present

## 2021-03-20 DIAGNOSIS — F17200 Nicotine dependence, unspecified, uncomplicated: Secondary | ICD-10-CM | POA: Diagnosis not present

## 2021-03-20 DIAGNOSIS — R002 Palpitations: Secondary | ICD-10-CM | POA: Diagnosis not present

## 2021-03-20 DIAGNOSIS — R0600 Dyspnea, unspecified: Secondary | ICD-10-CM | POA: Diagnosis not present

## 2021-03-29 DIAGNOSIS — M4184 Other forms of scoliosis, thoracic region: Secondary | ICD-10-CM | POA: Diagnosis not present

## 2021-03-29 DIAGNOSIS — M5134 Other intervertebral disc degeneration, thoracic region: Secondary | ICD-10-CM | POA: Diagnosis not present

## 2021-03-29 DIAGNOSIS — M2578 Osteophyte, vertebrae: Secondary | ICD-10-CM | POA: Diagnosis not present

## 2021-03-29 DIAGNOSIS — M438X4 Other specified deforming dorsopathies, thoracic region: Secondary | ICD-10-CM | POA: Diagnosis not present

## 2021-03-29 DIAGNOSIS — M5136 Other intervertebral disc degeneration, lumbar region: Secondary | ICD-10-CM | POA: Diagnosis not present

## 2021-03-29 DIAGNOSIS — Z981 Arthrodesis status: Secondary | ICD-10-CM | POA: Diagnosis not present

## 2021-03-29 DIAGNOSIS — M519 Unspecified thoracic, thoracolumbar and lumbosacral intervertebral disc disorder: Secondary | ICD-10-CM | POA: Diagnosis not present

## 2021-04-02 DIAGNOSIS — R002 Palpitations: Secondary | ICD-10-CM | POA: Diagnosis not present

## 2021-04-08 ENCOUNTER — Other Ambulatory Visit: Payer: Self-pay

## 2021-04-08 ENCOUNTER — Ambulatory Visit
Admission: RE | Admit: 2021-04-08 | Discharge: 2021-04-08 | Disposition: A | Discharge status: Home / Self Care | Payer: Medicaid Other | Source: Ambulatory Visit | Attending: Diagnostic Neuroimaging | Admitting: Diagnostic Neuroimaging

## 2021-04-08 DIAGNOSIS — R269 Unspecified abnormalities of gait and mobility: Secondary | ICD-10-CM

## 2021-04-08 DIAGNOSIS — R200 Anesthesia of skin: Secondary | ICD-10-CM | POA: Diagnosis not present

## 2021-04-16 ENCOUNTER — Telehealth: Payer: Self-pay

## 2021-04-16 NOTE — Telephone Encounter (Signed)
FYI pt's wife called back, message was relayed to her. No call back requested.

## 2021-04-16 NOTE — Telephone Encounter (Signed)
-----   Message from Penni Bombard, MD sent at 04/14/2021  7:03 PM EDT ----- Unremarkable imaging results. Please call patient. Continue current plan. -VRP

## 2021-04-16 NOTE — Telephone Encounter (Signed)
I called the pt and left a vm ( ok per dpr) with results of MRI. Pt advised to CB if he had any questions and if sx have worsened we could see him back for a f/u.  Phone staff can relay message.

## 2021-05-15 DIAGNOSIS — G629 Polyneuropathy, unspecified: Secondary | ICD-10-CM | POA: Diagnosis not present

## 2021-05-17 DIAGNOSIS — E785 Hyperlipidemia, unspecified: Secondary | ICD-10-CM | POA: Diagnosis not present

## 2021-05-17 DIAGNOSIS — E119 Type 2 diabetes mellitus without complications: Secondary | ICD-10-CM | POA: Diagnosis not present

## 2021-05-22 ENCOUNTER — Other Ambulatory Visit: Payer: Self-pay | Admitting: Family Medicine

## 2021-07-13 DIAGNOSIS — M47816 Spondylosis without myelopathy or radiculopathy, lumbar region: Secondary | ICD-10-CM | POA: Diagnosis not present

## 2021-07-13 DIAGNOSIS — M47812 Spondylosis without myelopathy or radiculopathy, cervical region: Secondary | ICD-10-CM | POA: Diagnosis not present

## 2021-07-13 DIAGNOSIS — M5136 Other intervertebral disc degeneration, lumbar region: Secondary | ICD-10-CM | POA: Diagnosis not present

## 2021-07-13 DIAGNOSIS — M50322 Other cervical disc degeneration at C5-C6 level: Secondary | ICD-10-CM | POA: Diagnosis not present

## 2021-07-13 DIAGNOSIS — M75102 Unspecified rotator cuff tear or rupture of left shoulder, not specified as traumatic: Secondary | ICD-10-CM | POA: Diagnosis not present

## 2021-07-13 DIAGNOSIS — Z981 Arthrodesis status: Secondary | ICD-10-CM | POA: Diagnosis not present

## 2021-07-24 DIAGNOSIS — H5213 Myopia, bilateral: Secondary | ICD-10-CM | POA: Diagnosis not present

## 2021-08-09 DIAGNOSIS — M5090 Cervical disc disorder, unspecified, unspecified cervical region: Secondary | ICD-10-CM | POA: Diagnosis not present

## 2021-08-09 DIAGNOSIS — E119 Type 2 diabetes mellitus without complications: Secondary | ICD-10-CM | POA: Diagnosis not present

## 2021-08-09 DIAGNOSIS — E785 Hyperlipidemia, unspecified: Secondary | ICD-10-CM | POA: Diagnosis not present

## 2021-08-23 ENCOUNTER — Ambulatory Visit: Payer: Medicaid Other | Admitting: Internal Medicine

## 2021-08-23 ENCOUNTER — Encounter: Payer: Self-pay | Admitting: Internal Medicine

## 2021-08-26 HISTORY — PX: INGUINAL HERNIA REPAIR: SUR1180

## 2021-09-04 DIAGNOSIS — S43432A Superior glenoid labrum lesion of left shoulder, initial encounter: Secondary | ICD-10-CM | POA: Diagnosis not present

## 2021-09-04 DIAGNOSIS — M7542 Impingement syndrome of left shoulder: Secondary | ICD-10-CM | POA: Diagnosis not present

## 2021-09-04 DIAGNOSIS — M7502 Adhesive capsulitis of left shoulder: Secondary | ICD-10-CM | POA: Diagnosis not present

## 2021-09-04 DIAGNOSIS — M19012 Primary osteoarthritis, left shoulder: Secondary | ICD-10-CM | POA: Diagnosis not present

## 2021-09-04 DIAGNOSIS — M67412 Ganglion, left shoulder: Secondary | ICD-10-CM | POA: Diagnosis not present

## 2021-09-04 DIAGNOSIS — R936 Abnormal findings on diagnostic imaging of limbs: Secondary | ICD-10-CM | POA: Diagnosis not present

## 2021-09-04 DIAGNOSIS — M65812 Other synovitis and tenosynovitis, left shoulder: Secondary | ICD-10-CM | POA: Diagnosis not present

## 2021-10-09 ENCOUNTER — Ambulatory Visit: Payer: Medicaid Other | Admitting: Gastroenterology
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
## 2021-10-15 ENCOUNTER — Ambulatory Visit: Payer: Medicaid Other | Admitting: Gastroenterology
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
## 2021-10-16 ENCOUNTER — Telehealth: Payer: Medicaid Other | Admitting: Gastroenterology

## 2021-10-16 ENCOUNTER — Other Ambulatory Visit: Payer: Self-pay

## 2021-10-16 ENCOUNTER — Telehealth: Payer: Self-pay

## 2021-10-16 ENCOUNTER — Telehealth: Payer: Self-pay | Admitting: *Deleted

## 2021-10-16 NOTE — Telephone Encounter (Signed)
Tried to call pt at 8:46am to strart 9:00 virtual visit. LMOVM for him to call back by 9:10am. Pt didn't call back for visit.  Erline Levine, please no show pt for virtual visit.

## 2021-10-16 NOTE — Telephone Encounter (Signed)
[name], you are scheduled for a virtual visit with your provider today.  Just as we do with appointments in the office, we must obtain your consent to participate.  Your consent will be active for this visit and any virtual visit you may have with one of our providers in the next 365 days.  If you have a MyChart account, I can also send a copy of this consent to you electronically.  All virtual visits are billed to your insurance company just like a traditional visit in the office.  As this is a virtual visit, video technology does not allow for your provider to perform a traditional examination.  This may limit your provider's ability to fully assess your condition.  If your provider identifies any concerns that need to be evaluated in person or the need to arrange testing such as labs, EKG, etc, we will make arrangements to do so.  Although advances in technology are sophisticated, we cannot ensure that it will always work on either your end or our end.  If the connection with a video visit is poor, we may have to switch to a telephone visit.  With either a video or telephone visit, we are not always able to ensure that we have a secure connection.   I need to obtain your verbal consent now.   Are you willing to proceed with your visit today?

## 2021-10-16 NOTE — Telephone Encounter (Signed)
Noted  

## 2021-10-16 NOTE — Telephone Encounter (Signed)
Pt consented to a virtual visit. 

## 2021-10-25 DIAGNOSIS — Z79899 Other long term (current) drug therapy: Secondary | ICD-10-CM | POA: Diagnosis not present

## 2021-10-25 DIAGNOSIS — K4090 Unilateral inguinal hernia, without obstruction or gangrene, not specified as recurrent: Secondary | ICD-10-CM | POA: Diagnosis not present

## 2021-10-25 DIAGNOSIS — J449 Chronic obstructive pulmonary disease, unspecified: Secondary | ICD-10-CM | POA: Diagnosis not present

## 2021-10-25 DIAGNOSIS — R1084 Generalized abdominal pain: Secondary | ICD-10-CM | POA: Diagnosis not present

## 2021-10-25 DIAGNOSIS — Z7982 Long term (current) use of aspirin: Secondary | ICD-10-CM | POA: Diagnosis not present

## 2021-10-25 DIAGNOSIS — K760 Fatty (change of) liver, not elsewhere classified: Secondary | ICD-10-CM | POA: Diagnosis not present

## 2021-10-25 DIAGNOSIS — E119 Type 2 diabetes mellitus without complications: Secondary | ICD-10-CM | POA: Diagnosis not present

## 2021-10-25 DIAGNOSIS — I700 Atherosclerosis of aorta: Secondary | ICD-10-CM | POA: Diagnosis not present

## 2021-10-25 DIAGNOSIS — R109 Unspecified abdominal pain: Secondary | ICD-10-CM | POA: Diagnosis not present

## 2021-10-26 ENCOUNTER — Telehealth: Payer: Self-pay

## 2021-10-26 NOTE — Telephone Encounter (Signed)
Transition Care Management Unsuccessful Follow-up Telephone Call ? ?Date of discharge and from where:  10/25/2021 from [facility] ? ?Attempts:  1st Attempt ? ?Reason for unsuccessful TCM follow-up call:  Left voice message ? ? ? ?

## 2021-10-29 NOTE — Telephone Encounter (Signed)
Transition Care Management Follow-up Telephone Call ?Date of discharge and from where: 10/25/2021 from [facility] ?How have you been since you were released from the hospital? Patient stated that he is feeling okay and did not have any questions or concerns at this time.  ?Any questions or concerns? No ? ?Items Reviewed: ?Did the pt receive and understand the discharge instructions provided? Yes  ?Medications obtained and verified? Yes  ?Other? No  ?Any new allergies since your discharge? No  ?Dietary orders reviewed? No ?Do you have support at home? Yes  ? ?Functional Questionnaire: (I = Independent and D = Dependent) ?ADLs: I ? ?Bathing/Dressing- I ? ?Meal Prep- I ? ?Eating- I ? ?Maintaining continence- I ? ?Transferring/Ambulation- I ? ?Managing Meds- I ? ?Follow up appointments reviewed: ? ?PCP Hospital f/u appt confirmed? No   ?Specialist Hospital f/u appt confirmed? Yes  Scheduled to see Levell July, MD on 10/31/2021 @ 09:00am. ?Are transportation arrangements needed? No  ?If their condition worsens, is the pt aware to call PCP or go to the Emergency Dept.? Yes ?Was the patient provided with contact information for the PCP's office or ED? Yes ?Was to pt encouraged to call back with questions or concerns? Yes ? ? ?

## 2021-10-31 DIAGNOSIS — K4090 Unilateral inguinal hernia, without obstruction or gangrene, not specified as recurrent: Secondary | ICD-10-CM | POA: Diagnosis not present

## 2021-11-03 IMAGING — MR MR FOOT*L* W/O CM
5 series · 40 of 40 positions shown · non-contrast
Comparison: Left foot x-rays dated September 23, 2020.

CLINICAL DATA: Fall.  Suspected underlying neuropathic arthropathy.

EXAM:
MRI OF THE LEFT FOOT WITHOUT CONTRAST
TECHNIQUE: Multiplanar, multisequence MR imaging of the left foot was
performed. No intravenous contrast was administered.

[Series 4: T1 · coronal · left · 3.0mm · 0.38mm/px · 12 of 60 slices shown (1 of 2)]
[im 1/60]
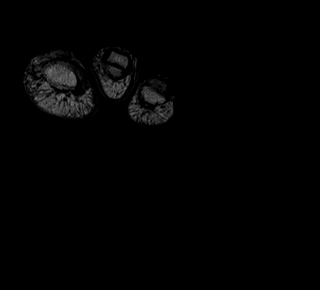
[im 6/60]
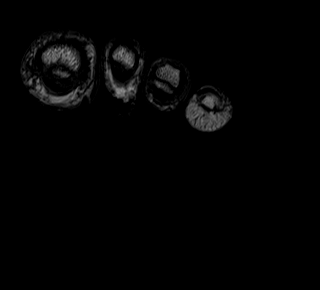
[im 11/60]
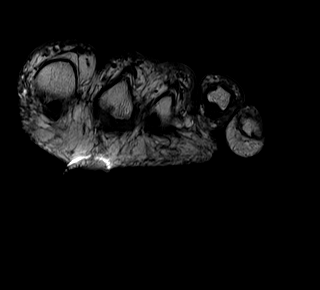
[im 17/60]
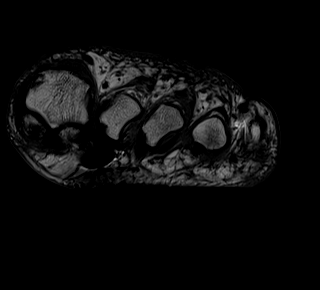
[im 22/60]
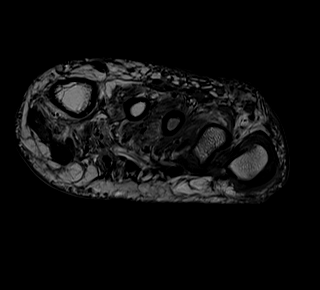
[im 27/60]
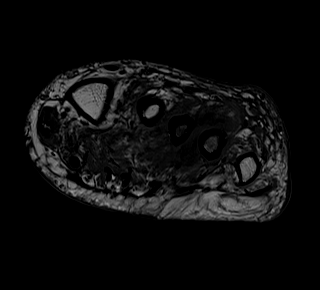
[im 33/60]
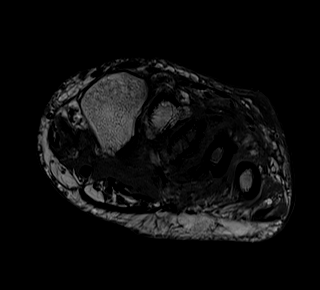
[im 38/60]
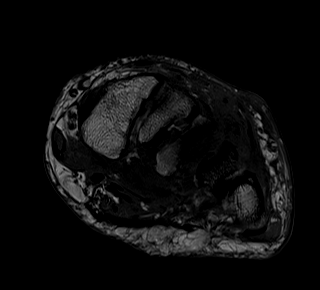
[im 43/60]
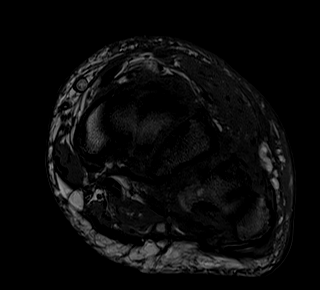
[im 49/60]
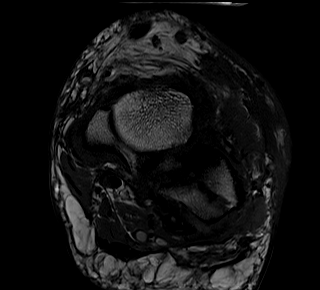
[im 54/60]
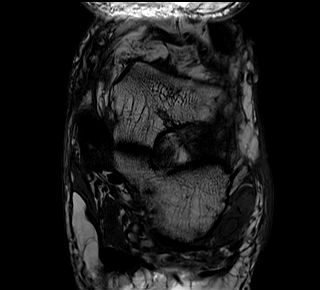
[im 60/60]
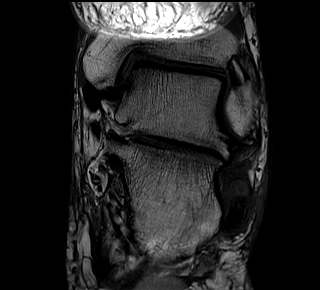

[Series 5: T2 fat-sat · coronal · left · 3.0mm · 0.38mm/px · 11 of 60 slices shown (1 of 2)]
[im 1/60]
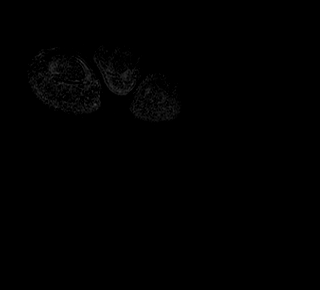
[im 6/60]
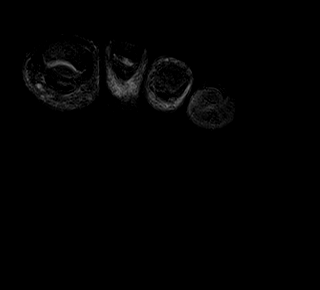
[im 12/60]
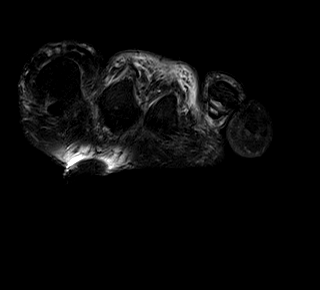
[im 18/60]
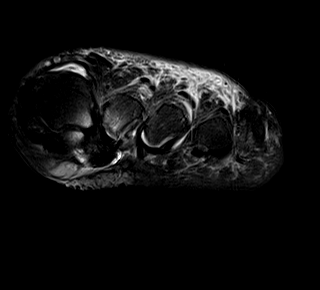
[im 24/60]
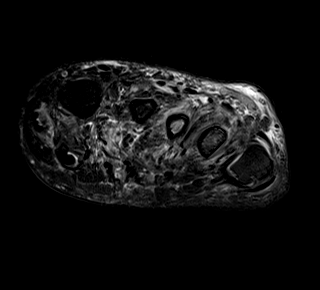
[im 30/60]
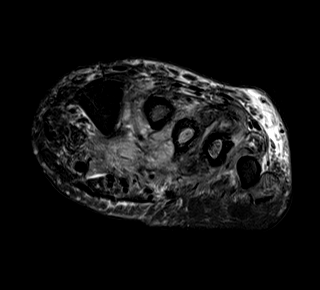
[im 36/60]
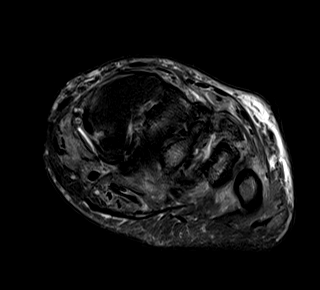
[im 42/60]
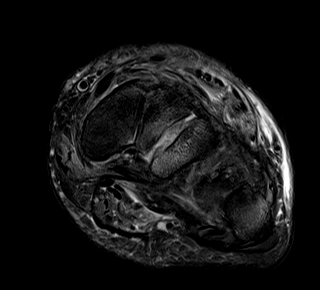
[im 48/60]
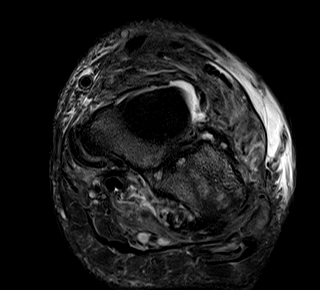
[im 54/60]
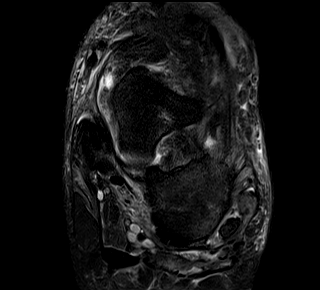
[im 60/60]
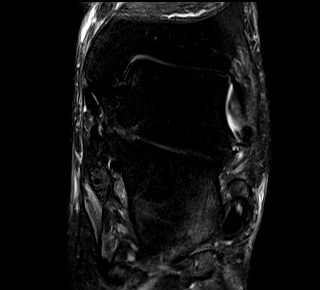

[Series 6: STIR · sagittal · left · 3.0mm · 0.78mm/px · 7 of 37 slices shown]
[im 1/37]
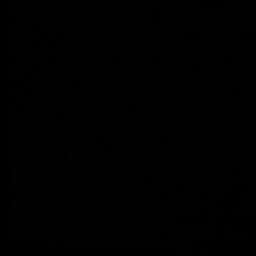
[im 7/37]
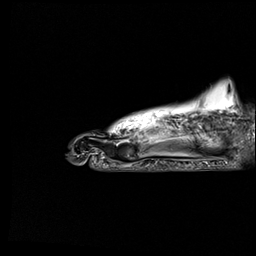
[im 13/37]
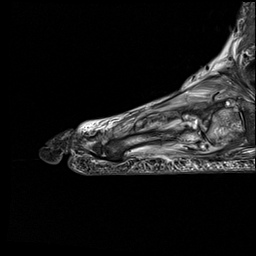
[im 19/37]
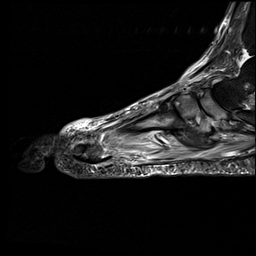
[im 25/37]
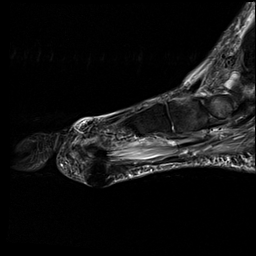
[im 31/37]
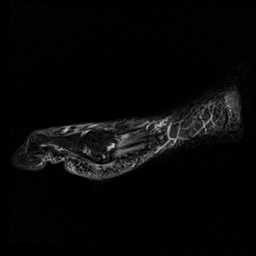
[im 37/37]
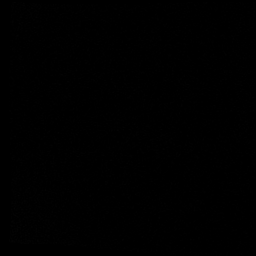

[Series 7: T2 fat-sat · axial · left · 3.0mm · 0.52mm/px · z∈[-97,+10]mm · 5 of 29 slices shown (2 of 2)]
[im 1/29]
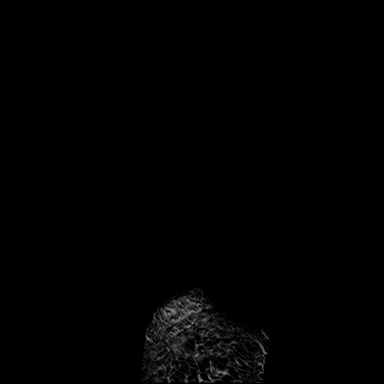
[im 8/29]
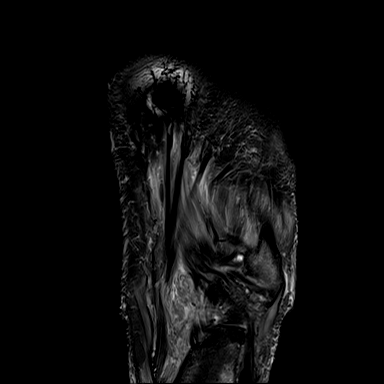
[im 15/29]
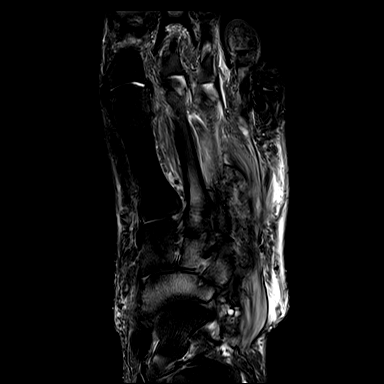
[im 22/29]
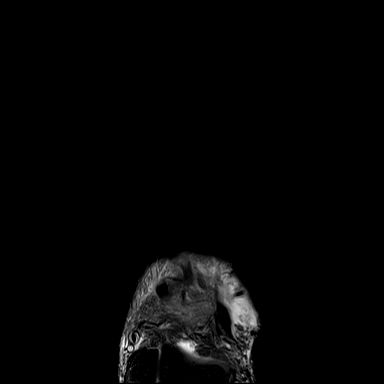
[im 29/29]
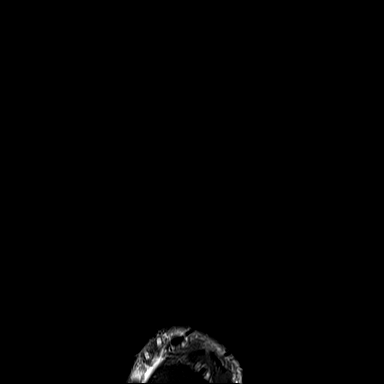

[Series 8: T1 · axial · left · 3.0mm · 0.52mm/px · z∈[-97,+10]mm · 5 of 29 slices shown (2 of 2)]
[im 1/29]
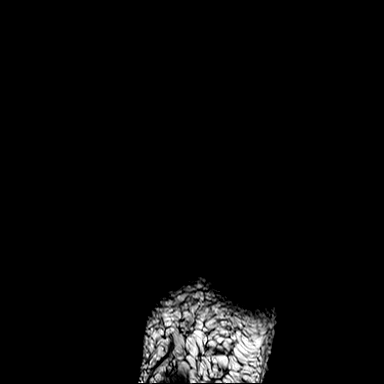
[im 8/29]
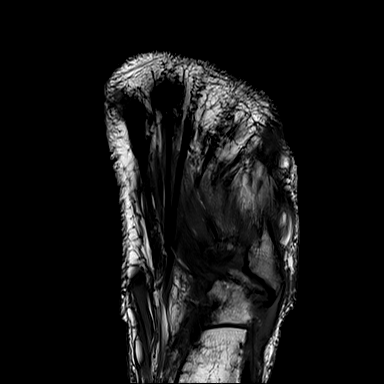
[im 15/29]
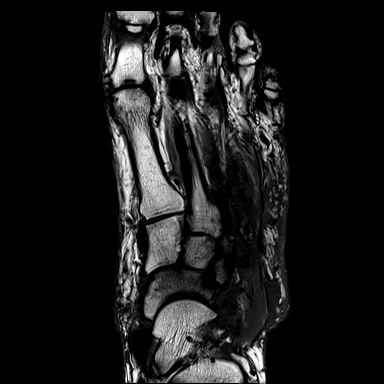
[im 22/29]
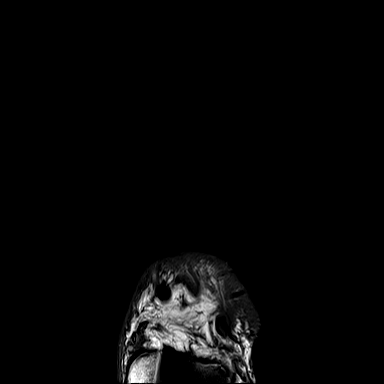
[im 29/29]
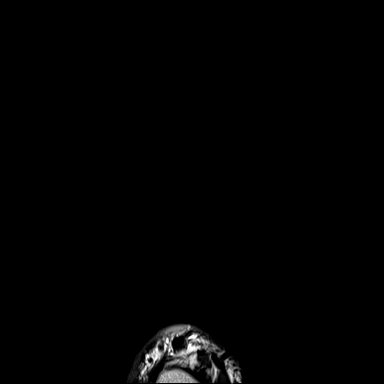

[40 of 40 positions shown; findings below may reference images not displayed]

FINDINGS: Bones/Joint/Cartilage

Prominent marrow edema and subchondral cysts centered on the midfoot
with involvement of the navicular, cuboid, middle and lateral
cuneiforms, and second through fifth metatarsals. Milder edema in
the medial cuneiform and anterior calcaneal process. Early bone loss
of the lateral cuneiform, cuboid, and bases of the third and fourth
metatarsals. Mild pes planus.

No fracture or dislocation. Normal alignment. No significant joint
effusion.

Ligaments

Collateral ligaments are intact.  Lisfranc ligament is intact.

Muscles and Tendons
Flexor and extensor tendons are intact. Increased T2 signal within
the intrinsic muscles of the forefoot, nonspecific, but likely
related to diabetic muscle changes.

Soft tissue
Dorsal foot soft tissue swelling. No fluid collection or hematoma.
No soft tissue mass. Susceptibility artifact related to small
metallic foreign body in the superficial plantar soft tissues at the
base of the second metatarsal head.
IMPRESSION: 1. Midfoot neuropathic arthropathy with early collapse.

## 2021-11-05 DIAGNOSIS — I2510 Atherosclerotic heart disease of native coronary artery without angina pectoris: Secondary | ICD-10-CM | POA: Diagnosis not present

## 2021-11-05 DIAGNOSIS — E782 Mixed hyperlipidemia: Secondary | ICD-10-CM | POA: Diagnosis not present

## 2021-11-27 DIAGNOSIS — R590 Localized enlarged lymph nodes: Secondary | ICD-10-CM | POA: Diagnosis not present

## 2021-11-27 DIAGNOSIS — K4090 Unilateral inguinal hernia, without obstruction or gangrene, not specified as recurrent: Secondary | ICD-10-CM | POA: Diagnosis not present

## 2022-02-01 DIAGNOSIS — W208XXA Other cause of strike by thrown, projected or falling object, initial encounter: Secondary | ICD-10-CM | POA: Diagnosis not present

## 2022-02-01 DIAGNOSIS — M50222 Other cervical disc displacement at C5-C6 level: Secondary | ICD-10-CM | POA: Diagnosis not present

## 2022-02-01 DIAGNOSIS — R200 Anesthesia of skin: Secondary | ICD-10-CM | POA: Diagnosis not present

## 2022-02-01 DIAGNOSIS — R519 Headache, unspecified: Secondary | ICD-10-CM | POA: Diagnosis not present

## 2022-02-01 DIAGNOSIS — M4802 Spinal stenosis, cervical region: Secondary | ICD-10-CM | POA: Diagnosis not present

## 2022-02-01 DIAGNOSIS — R918 Other nonspecific abnormal finding of lung field: Secondary | ICD-10-CM | POA: Diagnosis not present

## 2022-02-01 DIAGNOSIS — R202 Paresthesia of skin: Secondary | ICD-10-CM | POA: Diagnosis not present

## 2022-02-01 DIAGNOSIS — M542 Cervicalgia: Secondary | ICD-10-CM | POA: Diagnosis not present

## 2022-02-01 DIAGNOSIS — W228XXA Striking against or struck by other objects, initial encounter: Secondary | ICD-10-CM | POA: Diagnosis not present

## 2022-02-01 DIAGNOSIS — M50221 Other cervical disc displacement at C4-C5 level: Secondary | ICD-10-CM | POA: Diagnosis not present

## 2022-02-01 DIAGNOSIS — Y999 Unspecified external cause status: Secondary | ICD-10-CM | POA: Diagnosis not present

## 2022-02-01 DIAGNOSIS — M5021 Other cervical disc displacement,  high cervical region: Secondary | ICD-10-CM | POA: Diagnosis not present

## 2022-02-01 DIAGNOSIS — R531 Weakness: Secondary | ICD-10-CM | POA: Diagnosis not present

## 2022-02-02 DIAGNOSIS — R531 Weakness: Secondary | ICD-10-CM | POA: Diagnosis not present

## 2022-02-02 DIAGNOSIS — S0990XA Unspecified injury of head, initial encounter: Secondary | ICD-10-CM | POA: Diagnosis not present

## 2022-02-02 DIAGNOSIS — R200 Anesthesia of skin: Secondary | ICD-10-CM | POA: Diagnosis not present

## 2022-03-04 DIAGNOSIS — E785 Hyperlipidemia, unspecified: Secondary | ICD-10-CM | POA: Diagnosis not present

## 2022-03-04 DIAGNOSIS — M5090 Cervical disc disorder, unspecified, unspecified cervical region: Secondary | ICD-10-CM | POA: Diagnosis not present

## 2022-03-04 DIAGNOSIS — E119 Type 2 diabetes mellitus without complications: Secondary | ICD-10-CM | POA: Diagnosis not present

## 2022-04-26 DIAGNOSIS — M542 Cervicalgia: Secondary | ICD-10-CM | POA: Diagnosis not present

## 2022-04-26 DIAGNOSIS — Z4789 Encounter for other orthopedic aftercare: Secondary | ICD-10-CM | POA: Diagnosis not present

## 2022-04-26 DIAGNOSIS — Z981 Arthrodesis status: Secondary | ICD-10-CM | POA: Diagnosis not present

## 2022-05-14 ENCOUNTER — Ambulatory Visit: Payer: Medicaid Other | Admitting: Internal Medicine

## 2022-06-04 DIAGNOSIS — E785 Hyperlipidemia, unspecified: Secondary | ICD-10-CM | POA: Diagnosis not present

## 2022-06-04 DIAGNOSIS — E119 Type 2 diabetes mellitus without complications: Secondary | ICD-10-CM | POA: Diagnosis not present

## 2022-06-14 DIAGNOSIS — R262 Difficulty in walking, not elsewhere classified: Secondary | ICD-10-CM | POA: Diagnosis not present

## 2022-06-14 DIAGNOSIS — M546 Pain in thoracic spine: Secondary | ICD-10-CM | POA: Diagnosis not present

## 2022-06-14 DIAGNOSIS — M5416 Radiculopathy, lumbar region: Secondary | ICD-10-CM | POA: Diagnosis not present

## 2022-06-15 DIAGNOSIS — S93104A Unspecified dislocation of right toe(s), initial encounter: Secondary | ICD-10-CM | POA: Diagnosis not present

## 2022-06-15 DIAGNOSIS — W3400XA Accidental discharge from unspecified firearms or gun, initial encounter: Secondary | ICD-10-CM | POA: Diagnosis not present

## 2022-06-15 DIAGNOSIS — M79671 Pain in right foot: Secondary | ICD-10-CM | POA: Diagnosis not present

## 2022-07-25 DIAGNOSIS — M4714 Other spondylosis with myelopathy, thoracic region: Secondary | ICD-10-CM | POA: Diagnosis not present

## 2022-07-25 DIAGNOSIS — M4727 Other spondylosis with radiculopathy, lumbosacral region: Secondary | ICD-10-CM | POA: Diagnosis not present

## 2022-07-25 DIAGNOSIS — M5117 Intervertebral disc disorders with radiculopathy, lumbosacral region: Secondary | ICD-10-CM | POA: Diagnosis not present

## 2022-07-25 DIAGNOSIS — M5116 Intervertebral disc disorders with radiculopathy, lumbar region: Secondary | ICD-10-CM | POA: Diagnosis not present

## 2022-07-25 DIAGNOSIS — M5416 Radiculopathy, lumbar region: Secondary | ICD-10-CM | POA: Diagnosis not present

## 2022-07-25 DIAGNOSIS — M4807 Spinal stenosis, lumbosacral region: Secondary | ICD-10-CM | POA: Diagnosis not present

## 2022-07-25 DIAGNOSIS — M4726 Other spondylosis with radiculopathy, lumbar region: Secondary | ICD-10-CM | POA: Diagnosis not present

## 2022-07-25 DIAGNOSIS — M5104 Intervertebral disc disorders with myelopathy, thoracic region: Secondary | ICD-10-CM | POA: Diagnosis not present

## 2022-07-25 DIAGNOSIS — M48061 Spinal stenosis, lumbar region without neurogenic claudication: Secondary | ICD-10-CM | POA: Diagnosis not present

## 2022-08-05 DIAGNOSIS — M47819 Spondylosis without myelopathy or radiculopathy, site unspecified: Secondary | ICD-10-CM | POA: Diagnosis not present

## 2022-08-05 DIAGNOSIS — M5412 Radiculopathy, cervical region: Secondary | ICD-10-CM | POA: Diagnosis not present

## 2022-08-05 DIAGNOSIS — M47812 Spondylosis without myelopathy or radiculopathy, cervical region: Secondary | ICD-10-CM | POA: Diagnosis not present

## 2022-08-05 DIAGNOSIS — M50322 Other cervical disc degeneration at C5-C6 level: Secondary | ICD-10-CM | POA: Diagnosis not present

## 2022-08-05 DIAGNOSIS — Z981 Arthrodesis status: Secondary | ICD-10-CM | POA: Diagnosis not present

## 2022-08-09 DIAGNOSIS — Z7985 Long-term (current) use of injectable non-insulin antidiabetic drugs: Secondary | ICD-10-CM | POA: Diagnosis not present

## 2022-08-09 DIAGNOSIS — I10 Essential (primary) hypertension: Secondary | ICD-10-CM | POA: Diagnosis not present

## 2022-08-09 DIAGNOSIS — Z888 Allergy status to other drugs, medicaments and biological substances status: Secondary | ICD-10-CM | POA: Diagnosis not present

## 2022-08-09 DIAGNOSIS — Z91014 Allergy to mammalian meats: Secondary | ICD-10-CM | POA: Diagnosis not present

## 2022-08-09 DIAGNOSIS — E1169 Type 2 diabetes mellitus with other specified complication: Secondary | ICD-10-CM | POA: Diagnosis not present

## 2022-08-09 DIAGNOSIS — E871 Hypo-osmolality and hyponatremia: Secondary | ICD-10-CM | POA: Diagnosis not present

## 2022-08-09 DIAGNOSIS — F17210 Nicotine dependence, cigarettes, uncomplicated: Secondary | ICD-10-CM | POA: Diagnosis not present

## 2022-08-09 DIAGNOSIS — M19071 Primary osteoarthritis, right ankle and foot: Secondary | ICD-10-CM | POA: Diagnosis not present

## 2022-08-09 DIAGNOSIS — E11628 Type 2 diabetes mellitus with other skin complications: Secondary | ICD-10-CM | POA: Diagnosis not present

## 2022-08-09 DIAGNOSIS — Z79899 Other long term (current) drug therapy: Secondary | ICD-10-CM | POA: Diagnosis not present

## 2022-08-09 DIAGNOSIS — L02611 Cutaneous abscess of right foot: Secondary | ICD-10-CM | POA: Diagnosis not present

## 2022-08-09 DIAGNOSIS — J4489 Other specified chronic obstructive pulmonary disease: Secondary | ICD-10-CM | POA: Diagnosis not present

## 2022-08-09 DIAGNOSIS — E782 Mixed hyperlipidemia: Secondary | ICD-10-CM | POA: Diagnosis not present

## 2022-08-09 DIAGNOSIS — M65871 Other synovitis and tenosynovitis, right ankle and foot: Secondary | ICD-10-CM | POA: Diagnosis not present

## 2022-08-09 DIAGNOSIS — D72829 Elevated white blood cell count, unspecified: Secondary | ICD-10-CM | POA: Diagnosis not present

## 2022-08-09 DIAGNOSIS — E1165 Type 2 diabetes mellitus with hyperglycemia: Secondary | ICD-10-CM | POA: Diagnosis not present

## 2022-08-09 DIAGNOSIS — M5030 Other cervical disc degeneration, unspecified cervical region: Secondary | ICD-10-CM | POA: Diagnosis not present

## 2022-08-09 DIAGNOSIS — Z7982 Long term (current) use of aspirin: Secondary | ICD-10-CM | POA: Diagnosis not present

## 2022-08-09 DIAGNOSIS — L089 Local infection of the skin and subcutaneous tissue, unspecified: Secondary | ICD-10-CM | POA: Diagnosis not present

## 2022-08-09 DIAGNOSIS — L97419 Non-pressure chronic ulcer of right heel and midfoot with unspecified severity: Secondary | ICD-10-CM | POA: Diagnosis not present

## 2022-08-09 DIAGNOSIS — M868X7 Other osteomyelitis, ankle and foot: Secondary | ICD-10-CM | POA: Diagnosis not present

## 2022-08-09 DIAGNOSIS — S2095XA Superficial foreign body of unspecified parts of thorax, initial encounter: Secondary | ICD-10-CM | POA: Diagnosis not present

## 2022-08-09 DIAGNOSIS — M869 Osteomyelitis, unspecified: Secondary | ICD-10-CM | POA: Diagnosis not present

## 2022-08-09 DIAGNOSIS — L03115 Cellulitis of right lower limb: Secondary | ICD-10-CM | POA: Diagnosis not present

## 2022-08-09 DIAGNOSIS — J449 Chronic obstructive pulmonary disease, unspecified: Secondary | ICD-10-CM | POA: Diagnosis not present

## 2022-08-09 DIAGNOSIS — E1142 Type 2 diabetes mellitus with diabetic polyneuropathy: Secondary | ICD-10-CM | POA: Diagnosis not present

## 2022-08-09 DIAGNOSIS — G4730 Sleep apnea, unspecified: Secondary | ICD-10-CM | POA: Diagnosis not present

## 2022-08-09 DIAGNOSIS — M5136 Other intervertebral disc degeneration, lumbar region: Secondary | ICD-10-CM | POA: Diagnosis not present

## 2022-08-09 DIAGNOSIS — E11621 Type 2 diabetes mellitus with foot ulcer: Secondary | ICD-10-CM | POA: Diagnosis not present

## 2022-08-09 DIAGNOSIS — I2510 Atherosclerotic heart disease of native coronary artery without angina pectoris: Secondary | ICD-10-CM | POA: Diagnosis not present

## 2022-08-09 DIAGNOSIS — A419 Sepsis, unspecified organism: Secondary | ICD-10-CM | POA: Diagnosis not present

## 2022-08-09 DIAGNOSIS — A4101 Sepsis due to Methicillin susceptible Staphylococcus aureus: Secondary | ICD-10-CM | POA: Diagnosis not present

## 2022-08-09 DIAGNOSIS — Z7952 Long term (current) use of systemic steroids: Secondary | ICD-10-CM | POA: Diagnosis not present

## 2022-08-09 DIAGNOSIS — S90851A Superficial foreign body, right foot, initial encounter: Secondary | ICD-10-CM | POA: Diagnosis not present

## 2022-08-09 DIAGNOSIS — M659 Synovitis and tenosynovitis, unspecified: Secondary | ICD-10-CM | POA: Diagnosis not present

## 2022-08-09 DIAGNOSIS — L97519 Non-pressure chronic ulcer of other part of right foot with unspecified severity: Secondary | ICD-10-CM | POA: Diagnosis not present

## 2022-08-10 DIAGNOSIS — M86171 Other acute osteomyelitis, right ankle and foot: Secondary | ICD-10-CM | POA: Diagnosis not present

## 2022-08-11 DIAGNOSIS — M869 Osteomyelitis, unspecified: Secondary | ICD-10-CM | POA: Diagnosis not present

## 2022-08-11 DIAGNOSIS — Z9889 Other specified postprocedural states: Secondary | ICD-10-CM | POA: Diagnosis not present

## 2022-08-11 DIAGNOSIS — J449 Chronic obstructive pulmonary disease, unspecified: Secondary | ICD-10-CM | POA: Diagnosis not present

## 2022-08-11 DIAGNOSIS — E119 Type 2 diabetes mellitus without complications: Secondary | ICD-10-CM | POA: Diagnosis not present

## 2022-08-11 DIAGNOSIS — M868X7 Other osteomyelitis, ankle and foot: Secondary | ICD-10-CM | POA: Diagnosis not present

## 2022-08-11 DIAGNOSIS — L02611 Cutaneous abscess of right foot: Secondary | ICD-10-CM | POA: Diagnosis not present

## 2022-08-11 DIAGNOSIS — F17210 Nicotine dependence, cigarettes, uncomplicated: Secondary | ICD-10-CM | POA: Diagnosis not present

## 2022-08-11 DIAGNOSIS — I10 Essential (primary) hypertension: Secondary | ICD-10-CM | POA: Diagnosis not present

## 2022-08-11 DIAGNOSIS — E785 Hyperlipidemia, unspecified: Secondary | ICD-10-CM | POA: Diagnosis not present

## 2022-08-11 DIAGNOSIS — M86171 Other acute osteomyelitis, right ankle and foot: Secondary | ICD-10-CM | POA: Diagnosis not present

## 2022-08-11 DIAGNOSIS — A4101 Sepsis due to Methicillin susceptible Staphylococcus aureus: Secondary | ICD-10-CM | POA: Diagnosis not present

## 2022-08-12 DIAGNOSIS — Z720 Tobacco use: Secondary | ICD-10-CM | POA: Diagnosis not present

## 2022-08-12 DIAGNOSIS — A4101 Sepsis due to Methicillin susceptible Staphylococcus aureus: Secondary | ICD-10-CM | POA: Diagnosis not present

## 2022-08-12 DIAGNOSIS — E1165 Type 2 diabetes mellitus with hyperglycemia: Secondary | ICD-10-CM | POA: Diagnosis not present

## 2022-08-12 DIAGNOSIS — B9561 Methicillin susceptible Staphylococcus aureus infection as the cause of diseases classified elsewhere: Secondary | ICD-10-CM | POA: Diagnosis not present

## 2022-08-12 DIAGNOSIS — E1142 Type 2 diabetes mellitus with diabetic polyneuropathy: Secondary | ICD-10-CM | POA: Diagnosis not present

## 2022-08-12 DIAGNOSIS — F17200 Nicotine dependence, unspecified, uncomplicated: Secondary | ICD-10-CM | POA: Diagnosis not present

## 2022-08-12 DIAGNOSIS — R7881 Bacteremia: Secondary | ICD-10-CM | POA: Diagnosis not present

## 2022-08-12 DIAGNOSIS — J4489 Other specified chronic obstructive pulmonary disease: Secondary | ICD-10-CM | POA: Diagnosis not present

## 2022-08-12 DIAGNOSIS — M868X7 Other osteomyelitis, ankle and foot: Secondary | ICD-10-CM | POA: Diagnosis not present

## 2022-08-12 DIAGNOSIS — M86171 Other acute osteomyelitis, right ankle and foot: Secondary | ICD-10-CM | POA: Diagnosis not present

## 2022-08-12 DIAGNOSIS — I2510 Atherosclerotic heart disease of native coronary artery without angina pectoris: Secondary | ICD-10-CM | POA: Diagnosis not present

## 2022-08-13 DIAGNOSIS — E1165 Type 2 diabetes mellitus with hyperglycemia: Secondary | ICD-10-CM | POA: Diagnosis not present

## 2022-08-13 DIAGNOSIS — A4101 Sepsis due to Methicillin susceptible Staphylococcus aureus: Secondary | ICD-10-CM | POA: Diagnosis not present

## 2022-08-13 DIAGNOSIS — E1142 Type 2 diabetes mellitus with diabetic polyneuropathy: Secondary | ICD-10-CM | POA: Diagnosis not present

## 2022-08-13 DIAGNOSIS — F17200 Nicotine dependence, unspecified, uncomplicated: Secondary | ICD-10-CM | POA: Diagnosis not present

## 2022-08-13 DIAGNOSIS — Z720 Tobacco use: Secondary | ICD-10-CM | POA: Diagnosis not present

## 2022-08-13 DIAGNOSIS — M86171 Other acute osteomyelitis, right ankle and foot: Secondary | ICD-10-CM | POA: Diagnosis not present

## 2022-08-13 DIAGNOSIS — J4489 Other specified chronic obstructive pulmonary disease: Secondary | ICD-10-CM | POA: Diagnosis not present

## 2022-08-13 DIAGNOSIS — I2510 Atherosclerotic heart disease of native coronary artery without angina pectoris: Secondary | ICD-10-CM | POA: Diagnosis not present

## 2022-08-14 DIAGNOSIS — I2510 Atherosclerotic heart disease of native coronary artery without angina pectoris: Secondary | ICD-10-CM | POA: Diagnosis not present

## 2022-08-14 DIAGNOSIS — A4902 Methicillin resistant Staphylococcus aureus infection, unspecified site: Secondary | ICD-10-CM | POA: Diagnosis not present

## 2022-08-14 DIAGNOSIS — F17200 Nicotine dependence, unspecified, uncomplicated: Secondary | ICD-10-CM | POA: Diagnosis not present

## 2022-08-14 DIAGNOSIS — I3139 Other pericardial effusion (noninflammatory): Secondary | ICD-10-CM | POA: Diagnosis not present

## 2022-08-14 DIAGNOSIS — A4101 Sepsis due to Methicillin susceptible Staphylococcus aureus: Secondary | ICD-10-CM | POA: Diagnosis not present

## 2022-08-14 DIAGNOSIS — I088 Other rheumatic multiple valve diseases: Secondary | ICD-10-CM | POA: Diagnosis not present

## 2022-08-14 DIAGNOSIS — I10 Essential (primary) hypertension: Secondary | ICD-10-CM | POA: Diagnosis not present

## 2022-08-14 DIAGNOSIS — R7881 Bacteremia: Secondary | ICD-10-CM | POA: Diagnosis not present

## 2022-08-14 DIAGNOSIS — J449 Chronic obstructive pulmonary disease, unspecified: Secondary | ICD-10-CM | POA: Diagnosis not present

## 2022-08-14 DIAGNOSIS — E119 Type 2 diabetes mellitus without complications: Secondary | ICD-10-CM | POA: Diagnosis not present

## 2022-08-14 DIAGNOSIS — E1142 Type 2 diabetes mellitus with diabetic polyneuropathy: Secondary | ICD-10-CM | POA: Diagnosis not present

## 2022-08-14 DIAGNOSIS — I517 Cardiomegaly: Secondary | ICD-10-CM | POA: Diagnosis not present

## 2022-08-14 DIAGNOSIS — L02611 Cutaneous abscess of right foot: Secondary | ICD-10-CM | POA: Diagnosis not present

## 2022-08-14 DIAGNOSIS — F17210 Nicotine dependence, cigarettes, uncomplicated: Secondary | ICD-10-CM | POA: Diagnosis not present

## 2022-08-14 DIAGNOSIS — E1165 Type 2 diabetes mellitus with hyperglycemia: Secondary | ICD-10-CM | POA: Diagnosis not present

## 2022-08-14 DIAGNOSIS — E785 Hyperlipidemia, unspecified: Secondary | ICD-10-CM | POA: Diagnosis not present

## 2022-08-14 DIAGNOSIS — I340 Nonrheumatic mitral (valve) insufficiency: Secondary | ICD-10-CM | POA: Diagnosis not present

## 2022-08-14 DIAGNOSIS — J4489 Other specified chronic obstructive pulmonary disease: Secondary | ICD-10-CM | POA: Diagnosis not present

## 2022-08-14 DIAGNOSIS — M86171 Other acute osteomyelitis, right ankle and foot: Secondary | ICD-10-CM | POA: Diagnosis not present

## 2022-08-14 DIAGNOSIS — M869 Osteomyelitis, unspecified: Secondary | ICD-10-CM | POA: Diagnosis not present

## 2022-08-14 DIAGNOSIS — E871 Hypo-osmolality and hyponatremia: Secondary | ICD-10-CM | POA: Diagnosis not present

## 2022-08-15 DIAGNOSIS — I2510 Atherosclerotic heart disease of native coronary artery without angina pectoris: Secondary | ICD-10-CM | POA: Diagnosis not present

## 2022-08-15 DIAGNOSIS — A4101 Sepsis due to Methicillin susceptible Staphylococcus aureus: Secondary | ICD-10-CM | POA: Diagnosis not present

## 2022-08-15 DIAGNOSIS — F17200 Nicotine dependence, unspecified, uncomplicated: Secondary | ICD-10-CM | POA: Diagnosis not present

## 2022-08-15 DIAGNOSIS — E1142 Type 2 diabetes mellitus with diabetic polyneuropathy: Secondary | ICD-10-CM | POA: Diagnosis not present

## 2022-08-15 DIAGNOSIS — E1165 Type 2 diabetes mellitus with hyperglycemia: Secondary | ICD-10-CM | POA: Diagnosis not present

## 2022-08-15 DIAGNOSIS — J4489 Other specified chronic obstructive pulmonary disease: Secondary | ICD-10-CM | POA: Diagnosis not present

## 2022-08-15 DIAGNOSIS — M86171 Other acute osteomyelitis, right ankle and foot: Secondary | ICD-10-CM | POA: Diagnosis not present

## 2022-08-16 DIAGNOSIS — T879 Unspecified complications of amputation stump: Secondary | ICD-10-CM | POA: Diagnosis not present

## 2022-08-16 DIAGNOSIS — A4101 Sepsis due to Methicillin susceptible Staphylococcus aureus: Secondary | ICD-10-CM | POA: Diagnosis not present

## 2022-08-16 DIAGNOSIS — E11628 Type 2 diabetes mellitus with other skin complications: Secondary | ICD-10-CM | POA: Diagnosis not present

## 2022-08-16 DIAGNOSIS — F17200 Nicotine dependence, unspecified, uncomplicated: Secondary | ICD-10-CM | POA: Diagnosis not present

## 2022-08-16 DIAGNOSIS — E119 Type 2 diabetes mellitus without complications: Secondary | ICD-10-CM | POA: Diagnosis not present

## 2022-08-16 DIAGNOSIS — E871 Hypo-osmolality and hyponatremia: Secondary | ICD-10-CM | POA: Diagnosis not present

## 2022-08-16 DIAGNOSIS — L089 Local infection of the skin and subcutaneous tissue, unspecified: Secondary | ICD-10-CM | POA: Diagnosis not present

## 2022-08-16 DIAGNOSIS — I2510 Atherosclerotic heart disease of native coronary artery without angina pectoris: Secondary | ICD-10-CM | POA: Diagnosis not present

## 2022-08-16 DIAGNOSIS — M86171 Other acute osteomyelitis, right ankle and foot: Secondary | ICD-10-CM | POA: Diagnosis not present

## 2022-08-16 DIAGNOSIS — I10 Essential (primary) hypertension: Secondary | ICD-10-CM | POA: Diagnosis not present

## 2022-08-16 DIAGNOSIS — L0889 Other specified local infections of the skin and subcutaneous tissue: Secondary | ICD-10-CM | POA: Diagnosis not present

## 2022-08-16 DIAGNOSIS — M869 Osteomyelitis, unspecified: Secondary | ICD-10-CM | POA: Diagnosis not present

## 2022-08-16 DIAGNOSIS — R7881 Bacteremia: Secondary | ICD-10-CM | POA: Diagnosis not present

## 2022-08-17 DIAGNOSIS — E1165 Type 2 diabetes mellitus with hyperglycemia: Secondary | ICD-10-CM | POA: Diagnosis not present

## 2022-08-17 DIAGNOSIS — Z720 Tobacco use: Secondary | ICD-10-CM | POA: Diagnosis not present

## 2022-08-17 DIAGNOSIS — E11628 Type 2 diabetes mellitus with other skin complications: Secondary | ICD-10-CM | POA: Diagnosis not present

## 2022-08-17 DIAGNOSIS — M869 Osteomyelitis, unspecified: Secondary | ICD-10-CM | POA: Diagnosis not present

## 2022-08-17 DIAGNOSIS — F17200 Nicotine dependence, unspecified, uncomplicated: Secondary | ICD-10-CM | POA: Diagnosis not present

## 2022-08-17 DIAGNOSIS — M86171 Other acute osteomyelitis, right ankle and foot: Secondary | ICD-10-CM | POA: Diagnosis not present

## 2022-08-17 DIAGNOSIS — A4101 Sepsis due to Methicillin susceptible Staphylococcus aureus: Secondary | ICD-10-CM | POA: Diagnosis not present

## 2022-08-17 DIAGNOSIS — E1142 Type 2 diabetes mellitus with diabetic polyneuropathy: Secondary | ICD-10-CM | POA: Diagnosis not present

## 2022-08-17 DIAGNOSIS — E871 Hypo-osmolality and hyponatremia: Secondary | ICD-10-CM | POA: Diagnosis not present

## 2022-08-17 DIAGNOSIS — L089 Local infection of the skin and subcutaneous tissue, unspecified: Secondary | ICD-10-CM | POA: Diagnosis not present

## 2022-08-21 DIAGNOSIS — L02611 Cutaneous abscess of right foot: Secondary | ICD-10-CM | POA: Diagnosis not present

## 2022-08-21 DIAGNOSIS — M869 Osteomyelitis, unspecified: Secondary | ICD-10-CM | POA: Diagnosis not present

## 2022-08-21 DIAGNOSIS — A4101 Sepsis due to Methicillin susceptible Staphylococcus aureus: Secondary | ICD-10-CM | POA: Diagnosis not present

## 2022-08-21 DIAGNOSIS — E871 Hypo-osmolality and hyponatremia: Secondary | ICD-10-CM | POA: Diagnosis not present

## 2022-08-22 DIAGNOSIS — M869 Osteomyelitis, unspecified: Secondary | ICD-10-CM | POA: Diagnosis not present

## 2022-08-22 DIAGNOSIS — E785 Hyperlipidemia, unspecified: Secondary | ICD-10-CM | POA: Diagnosis not present

## 2022-08-22 DIAGNOSIS — E119 Type 2 diabetes mellitus without complications: Secondary | ICD-10-CM | POA: Diagnosis not present

## 2022-08-22 DIAGNOSIS — M5090 Cervical disc disorder, unspecified, unspecified cervical region: Secondary | ICD-10-CM | POA: Diagnosis not present

## 2022-08-22 DIAGNOSIS — R12 Heartburn: Secondary | ICD-10-CM | POA: Diagnosis not present

## 2022-09-05 DIAGNOSIS — Z4789 Encounter for other orthopedic aftercare: Secondary | ICD-10-CM | POA: Diagnosis not present

## 2022-09-13 DIAGNOSIS — M542 Cervicalgia: Secondary | ICD-10-CM | POA: Diagnosis not present

## 2022-10-24 DIAGNOSIS — E1159 Type 2 diabetes mellitus with other circulatory complications: Secondary | ICD-10-CM | POA: Diagnosis not present

## 2022-10-24 DIAGNOSIS — L97419 Non-pressure chronic ulcer of right heel and midfoot with unspecified severity: Secondary | ICD-10-CM | POA: Diagnosis not present

## 2022-10-24 DIAGNOSIS — M14672 Charcot's joint, left ankle and foot: Secondary | ICD-10-CM | POA: Diagnosis not present

## 2022-12-12 DIAGNOSIS — E1159 Type 2 diabetes mellitus with other circulatory complications: Secondary | ICD-10-CM | POA: Diagnosis not present

## 2022-12-12 DIAGNOSIS — G959 Disease of spinal cord, unspecified: Secondary | ICD-10-CM | POA: Diagnosis not present

## 2022-12-12 DIAGNOSIS — M5030 Other cervical disc degeneration, unspecified cervical region: Secondary | ICD-10-CM | POA: Diagnosis not present

## 2022-12-12 DIAGNOSIS — Q2112 Patent foramen ovale: Secondary | ICD-10-CM | POA: Diagnosis not present

## 2022-12-12 DIAGNOSIS — M5136 Other intervertebral disc degeneration, lumbar region: Secondary | ICD-10-CM | POA: Diagnosis not present

## 2022-12-12 DIAGNOSIS — Z981 Arthrodesis status: Secondary | ICD-10-CM | POA: Diagnosis not present
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
## 2022-12-16 ENCOUNTER — Ambulatory Visit: Payer: Medicaid Other | Admitting: Gastroenterology

## 2022-12-16 ENCOUNTER — Encounter: Payer: Self-pay | Admitting: Gastroenterology

## 2022-12-16 ENCOUNTER — Telehealth: Payer: Self-pay | Admitting: Gastroenterology

## 2022-12-16 VITALS — BP 128/84 | HR 66 | Temp 98.4°F | Ht 76.0 in | Wt 278.6 lb

## 2022-12-16 DIAGNOSIS — R1310 Dysphagia, unspecified: Secondary | ICD-10-CM | POA: Insufficient documentation

## 2022-12-16 DIAGNOSIS — R1319 Other dysphagia: Secondary | ICD-10-CM | POA: Diagnosis not present

## 2022-12-16 DIAGNOSIS — R1110 Vomiting, unspecified: Secondary | ICD-10-CM

## 2022-12-16 DIAGNOSIS — R1013 Epigastric pain: Secondary | ICD-10-CM

## 2022-12-16 NOTE — Telephone Encounter (Signed)
Patient seen in the office today.  History of dysphagia, regurgitation, epigastric pain.  Please schedule EGD with esophageal dilation with Dr. Jena Gauss.  ASA 3.  Hold semaglutide 7 days prior to procedure.

## 2022-12-16 NOTE — Patient Instructions (Signed)
We will be in touch after I review your most recent imaging of your neck. If safe, we will proceed with endoscopy to further evaluate your swallowing and regurgitation.

## 2022-12-17 NOTE — Telephone Encounter (Signed)
LMOVM to return call.

## 2022-12-18 DIAGNOSIS — M5090 Cervical disc disorder, unspecified, unspecified cervical region: Secondary | ICD-10-CM | POA: Diagnosis not present

## 2022-12-18 DIAGNOSIS — R12 Heartburn: Secondary | ICD-10-CM | POA: Diagnosis not present

## 2022-12-18 DIAGNOSIS — E119 Type 2 diabetes mellitus without complications: Secondary | ICD-10-CM | POA: Diagnosis not present

## 2022-12-18 DIAGNOSIS — E785 Hyperlipidemia, unspecified: Secondary | ICD-10-CM | POA: Diagnosis not present

## 2022-12-18 DIAGNOSIS — M869 Osteomyelitis, unspecified: Secondary | ICD-10-CM | POA: Diagnosis not present

## 2022-12-30 DIAGNOSIS — M5030 Other cervical disc degeneration, unspecified cervical region: Secondary | ICD-10-CM | POA: Diagnosis not present

## 2022-12-30 DIAGNOSIS — G959 Disease of spinal cord, unspecified: Secondary | ICD-10-CM | POA: Diagnosis not present

## 2022-12-30 DIAGNOSIS — M47812 Spondylosis without myelopathy or radiculopathy, cervical region: Secondary | ICD-10-CM | POA: Diagnosis not present

## 2022-12-31 ENCOUNTER — Encounter: Payer: Self-pay | Admitting: *Deleted

## 2022-12-31 NOTE — Telephone Encounter (Signed)
LMTRC. Letter mailed 

## 2023-01-06 ENCOUNTER — Encounter: Payer: Self-pay | Admitting: *Deleted

## 2023-01-06 NOTE — Telephone Encounter (Signed)
Pt has been scheduled for 02/05/23, instructions mailed 

## 2023-01-10 DIAGNOSIS — M50121 Cervical disc disorder at C4-C5 level with radiculopathy: Secondary | ICD-10-CM | POA: Diagnosis not present

## 2023-01-10 DIAGNOSIS — M50123 Cervical disc disorder at C6-C7 level with radiculopathy: Secondary | ICD-10-CM | POA: Diagnosis not present

## 2023-01-10 DIAGNOSIS — M4722 Other spondylosis with radiculopathy, cervical region: Secondary | ICD-10-CM | POA: Diagnosis not present

## 2023-01-10 DIAGNOSIS — M50122 Cervical disc disorder at C5-C6 level with radiculopathy: Secondary | ICD-10-CM | POA: Diagnosis not present

## 2023-01-10 DIAGNOSIS — G959 Disease of spinal cord, unspecified: Secondary | ICD-10-CM | POA: Diagnosis not present

## 2023-01-29 NOTE — Patient Instructions (Signed)
Lemar Lyga  01/29/2023     @PREFPERIOPPHARMACY @   Your procedure is scheduled on  02/05/2023.   Report to [facility] at  1200  P.M.   Call this number if you have problems the morning of surgery:  [phone]  If you experience any cold or flu symptoms such as cough, fever, chills, shortness of breath, etc. between now and your scheduled surgery, please notify us at the above number.   Remember:  Follow the diet instructions given to you by the office.       Your last dose of Ozempic should have been on 01/28/2023 or before.     Take these medicines the morning of surgery with A SIP OF WATER                                   pepcid, gabapentin.     Do not wear jewelry, make-up or nail polish, including gel polish,  artificial nails, or any other type of covering on natural nails (fingers and  toes).  Do not wear lotions, powders, or perfumes, or deodorant.  Do not shave 48 hours prior to surgery.  Men may shave face and neck.  Do not bring valuables to the hospital.  [facility] is not responsible for any belongings or valuables.  Contacts, dentures or bridgework may not be worn into surgery.  Leave your suitcase in the car.  After surgery it may be brought to your room.  For patients admitted to the hospital, discharge time will be determined by your treatment team.  Patients discharged the day of surgery will not be allowed to drive home and must have someone with them for 24 hours.    Special instructions:   DO NOT smoke tobacco or vape for 24 hours before your procedure.  Please read over the following fact sheets that you were given. Anesthesia Post-op Instructions and Care and Recovery After Surgery      Upper Endoscopy, Adult, Care After After the procedure, it is common to have a sore throat. It is also common to have: Mild stomach pain or discomfort. Bloating. Nausea. Follow these instructions at home: The instructions below may help you  care for yourself at home. Your health care provider may give you more instructions. If you have questions, ask your health care provider. If you were given a sedative during the procedure, it can affect you for several hours. Do not drive or operate machinery until your health care provider says that it is safe. If you will be going home right after the procedure, plan to have a responsible adult: Take you home from the hospital or clinic. You will not be allowed to drive. Care for you for the time you are told. Follow instructions from your health care provider about what you may eat and drink. Return to your normal activities as told by your health care provider. Ask your health care provider what activities are safe for you. Take over-the-counter and prescription medicines only as told by your health care provider. Contact a health care provider if you: Have a sore throat that lasts longer than one day. Have trouble swallowing. Have a fever. Get help right away if you: Vomit blood or your vomit looks like coffee grounds. Have bloody, black, or tarry stools. Have a very bad sore throat or you cannot swallow. Have difficulty breathing or very  bad pain in your chest or abdomen. These symptoms may be an emergency. Get help right away. Call 911. Do not wait to see if the symptoms will go away. Do not drive yourself to the hospital. Summary After the procedure, it is common to have a sore throat, mild stomach discomfort, bloating, and nausea. If you were given a sedative during the procedure, it can affect you for several hours. Do not drive until your health care provider says that it is safe. Follow instructions from your health care provider about what you may eat and drink. Return to your normal activities as told by your health care provider. This information is not intended to replace advice given to you by your health care provider. Make sure you discuss any questions you have with your  health care provider. Document Revised: 11/21/2021 Document Reviewed: 11/21/2021 Elsevier Patient Education  2024 Elsevier Inc. Esophageal Dilatation Esophageal dilatation, also called esophageal dilation, is a procedure to widen or open a blocked or narrowed part of the esophagus. The esophagus is the part of the body that moves food and liquid from the mouth to the stomach. You may need this procedure if: You have a buildup of scar tissue in your esophagus that makes it difficult, painful, or impossible to swallow. This can be caused by gastroesophageal reflux disease (GERD). You have cancer of the esophagus. There is a problem with how food moves through your esophagus. In some cases, you may need this procedure repeated at a later time to dilate the esophagus gradually. Tell a health care provider about: Any allergies you have. All medicines you are taking, including vitamins, herbs, eye drops, creams, and over-the-counter medicines. Any problems you or family members have had with anesthetic medicines. Any blood disorders you have. Any surgeries you have had. Any medical conditions you have. Any antibiotic medicines you are required to take before dental procedures. Whether you are pregnant or may be pregnant. What are the risks? Generally, this is a safe procedure. However, problems may occur, including: Bleeding due to a tear in the lining of the esophagus. A hole, or perforation, in the esophagus. What happens before the procedure? Ask your health care provider about: Changing or stopping your regular medicines. This is especially important if you are taking diabetes medicines or blood thinners. Taking medicines such as aspirin and ibuprofen. These medicines can thin your blood. Do not take these medicines unless your health care provider tells you to take them. Taking over-the-counter medicines, vitamins, herbs, and supplements. Follow instructions from your health care provider  about eating or drinking restrictions. Plan to have a responsible adult take you home from the hospital or clinic. Plan to have a responsible adult care for you for the time you are told after you leave the hospital or clinic. This is important. What happens during the procedure? You may be given a medicine to help you relax (sedative). A numbing medicine may be sprayed into the back of your throat, or you may gargle the medicine. Your health care provider may perform the dilatation using various surgical instruments, such as: Simple dilators. This instrument is carefully placed in the esophagus to stretch it. Guided wire bougies. This involves using an endoscope to insert a wire into the esophagus. A dilator is passed over this wire to enlarge the esophagus. Then the wire is removed. Balloon dilators. An endoscope with a small balloon is inserted into the esophagus. The balloon is inflated to stretch the esophagus and open it up.  The procedure may vary among health care providers and hospitals. What can I expect after the procedure? Your blood pressure, heart rate, breathing rate, and blood oxygen level will be monitored until you leave the hospital or clinic. Your throat may feel slightly sore and numb. This will get better over time. You will not be allowed to eat or drink until your throat is no longer numb. When you are able to drink, urinate, and sit on the edge of the bed without nausea or dizziness, you may be able to return home. Follow these instructions at home: Take over-the-counter and prescription medicines only as told by your health care provider. If you were given a sedative during the procedure, it can affect you for several hours. Do not drive or operate machinery until your health care provider says that it is safe. Plan to have a responsible adult care for you for the time you are told. This is important. Follow instructions from your health care provider about any eating or  drinking restrictions. Do not use any products that contain nicotine or tobacco, such as cigarettes, e-cigarettes, and chewing tobacco. If you need help quitting, ask your health care provider. Keep all follow-up visits. This is important. Contact a health care provider if: You have a fever. You have pain that is not relieved by medicine. Get help right away if: You have chest pain. You have trouble breathing. You have trouble swallowing. You vomit blood. You have black, tarry, or bloody stools. These symptoms may represent a serious problem that is an emergency. Do not wait to see if the symptoms will go away. Get medical help right away. Call your local emergency services (911 in the [place] Do not drive yourself to the hospital. Summary Esophageal dilatation, also called esophageal dilation, is a procedure to widen or open a blocked or narrowed part of the esophagus. Plan to have a responsible adult take you home from the hospital or clinic. For this procedure, a numbing medicine may be sprayed into the back of your throat, or you may gargle the medicine. Do not drive or operate machinery until your health care provider says that it is safe. This information is not intended to replace advice given to you by your health care provider. Make sure you discuss any questions you have with your health care provider. Document Revised: 12/29/2019 Document Reviewed: 12/29/2019 Elsevier Patient Education  2024 Elsevier Inc. Monitored Anesthesia Care, Care After The following information offers guidance on how to care for yourself after your procedure. Your health care provider may also give you more specific instructions. If you have problems or questions, contact your health care provider. What can I expect after the procedure? After the procedure, it is common to have: Tiredness. Little or no memory about what happened during or after the procedure. Impaired judgment when it comes to making  decisions. Nausea or vomiting. Some trouble with balance. Follow these instructions at home: For the time period you were told by your health care provider:  Rest. Do not participate in activities where you could fall or become injured. Do not drive or use machinery. Do not drink alcohol. Do not take sleeping pills or medicines that cause drowsiness. Do not make important decisions or sign legal documents. Do not take care of children on your own. Medicines Take over-the-counter and prescription medicines only as told by your health care provider. If you were prescribed antibiotics, take them as told by your health care provider. Do not stop using  the antibiotic even if you start to feel better. Eating and drinking Follow instructions from your health care provider about what you may eat and drink. Drink enough fluid to keep your urine pale yellow. If you vomit: Drink clear fluids slowly and in small amounts as you are able. Clear fluids include water, ice chips, low-calorie sports drinks, and fruit juice that has water added to it (diluted fruit juice). Eat light and bland foods in small amounts as you are able. These foods include bananas, applesauce, rice, lean meats, toast, and crackers. General instructions  Have a responsible adult stay with you for the time you are told. It is important to have someone help care for you until you are awake and alert. If you have sleep apnea, surgery and some medicines can increase your risk for breathing problems. Follow instructions from your health care provider about wearing your sleep device: When you are sleeping. This includes during daytime naps. While taking prescription pain medicines, sleeping medicines, or medicines that make you drowsy. Do not use any products that contain nicotine or tobacco. These products include cigarettes, chewing tobacco, and vaping devices, such as e-cigarettes. If you need help quitting, ask your health care  provider. Contact a health care provider if: You feel nauseous or vomit every time you eat or drink. You feel light-headed. You are still sleepy or having trouble with balance after 24 hours. You get a rash. You have a fever. You have redness or swelling around the IV site. Get help right away if: You have trouble breathing. You have new confusion after you get home. These symptoms may be an emergency. Get help right away. Call 911. Do not wait to see if the symptoms will go away. Do not drive yourself to the hospital. This information is not intended to replace advice given to you by your health care provider. Make sure you discuss any questions you have with your health care provider. Document Revised: 01/07/2022 Document Reviewed: 01/07/2022 Elsevier Patient Education  2024 [employer].

## 2023-01-31 ENCOUNTER — Encounter (HOSPITAL_COMMUNITY)
Admission: RE | Admit: 2023-01-31 | Discharge: 2023-01-31 | Disposition: A | Discharge status: Home / Self Care | Payer: Medicaid Other | Source: Ambulatory Visit | Attending: Internal Medicine | Admitting: Internal Medicine

## 2023-01-31 VITALS — BP 135/77 | HR 68 | Temp 98.5°F | Resp 18 | Ht 76.0 in | Wt 278.7 lb

## 2023-01-31 DIAGNOSIS — E119 Type 2 diabetes mellitus without complications: Secondary | ICD-10-CM | POA: Insufficient documentation

## 2023-01-31 DIAGNOSIS — F17200 Nicotine dependence, unspecified, uncomplicated: Secondary | ICD-10-CM | POA: Insufficient documentation

## 2023-01-31 DIAGNOSIS — I152 Hypertension secondary to endocrine disorders: Secondary | ICD-10-CM | POA: Diagnosis not present

## 2023-01-31 DIAGNOSIS — Z01818 Encounter for other preprocedural examination: Secondary | ICD-10-CM | POA: Diagnosis not present

## 2023-01-31 DIAGNOSIS — M47812 Spondylosis without myelopathy or radiculopathy, cervical region: Secondary | ICD-10-CM | POA: Diagnosis not present

## 2023-01-31 DIAGNOSIS — E1159 Type 2 diabetes mellitus with other circulatory complications: Secondary | ICD-10-CM | POA: Diagnosis not present

## 2023-01-31 DIAGNOSIS — G959 Disease of spinal cord, unspecified: Secondary | ICD-10-CM | POA: Diagnosis not present

## 2023-01-31 DIAGNOSIS — M5030 Other cervical disc degeneration, unspecified cervical region: Secondary | ICD-10-CM | POA: Diagnosis not present

## 2023-01-31 LAB — BASIC METABOLIC PANEL
Anion gap: 9 (ref 5–15)
BUN: 9 mg/dL (ref 8–23)
CO2: 24 mmol/L (ref 22–32)
Calcium: 8.5 mg/dL — ABNORMAL LOW (ref 8.9–10.3)
Chloride: 101 mmol/L (ref 98–111)
Creatinine, Ser: 0.84 mg/dL (ref 0.61–1.24)
GFR, Estimated: >60 mL/min (ref >60)
Glucose, Bld: 158 mg/dL — ABNORMAL HIGH (ref 70–99)
Potassium: 3.7 mmol/L (ref 3.5–5.1)
Sodium: 134 mmol/L — ABNORMAL LOW (ref 135–145)

## 2023-01-31 NOTE — Progress Notes (Signed)
Patient did not follow instructions for stopping the Ozempic so he will need to be rescheduled for EGD/ED.

## 2023-02-03 ENCOUNTER — Telehealth: Payer: Self-pay | Admitting: *Deleted

## 2023-02-03 NOTE — Telephone Encounter (Signed)
-----   Message from Jethro Bolus, RN sent at 01/31/2023  1:21 PM EDT ----- Regarding: Reschedule Hello everyone! Patient will need to be rescheduled.  He didn't stop his Ozempic.

## 2023-02-03 NOTE — Telephone Encounter (Signed)
LMOVM advising we will call to reschedule once we receive Dr. Luvenia Starch future schedule

## 2023-02-05 ENCOUNTER — Ambulatory Visit (HOSPITAL_COMMUNITY): Admission: RE | Admit: 2023-02-05 | Payer: Medicaid Other | Source: Home / Self Care | Admitting: Internal Medicine

## 2023-02-05 ENCOUNTER — Encounter (HOSPITAL_COMMUNITY): Admission: RE | Payer: Self-pay | Source: Home / Self Care

## 2023-02-05 SURGERY — ESOPHAGOGASTRODUODENOSCOPY (EGD) WITH PROPOFOL
Anesthesia: Monitor Anesthesia Care

## 2023-02-07 DIAGNOSIS — J449 Chronic obstructive pulmonary disease, unspecified: Secondary | ICD-10-CM | POA: Diagnosis not present

## 2023-02-07 DIAGNOSIS — J189 Pneumonia, unspecified organism: Secondary | ICD-10-CM | POA: Diagnosis not present

## 2023-02-11 NOTE — Telephone Encounter (Signed)
Advised pt's wife Matthew Stewart that provider is booking into August but we do not have his schedule at this time. Once we get his schedule we will give pt a call to reschedule his procedure and he may resume his Ozempic. Verbalized understanding

## 2023-02-11 NOTE — Telephone Encounter (Signed)
Pt's wife Tinnie Gens (on [place]) left vm wanting to reschedule his procedure.   LM with family member to have Jennie to call back.

## 2023-02-13 ENCOUNTER — Telehealth: Payer: Self-pay | Admitting: Internal Medicine

## 2023-02-13 NOTE — Telephone Encounter (Signed)
Pt's wife called saying patient had labs done at [facility] on 6/11 and asked for [place] to forward labs to Dr Milinda Cave in [place]. I do not see where we ordered labs or see any labs in epic. Please advise. [phone]

## 2023-02-13 NOTE — Telephone Encounter (Signed)
I called patient back, LMOM, that I forwarded lab to [facility]

## 2023-02-20 DIAGNOSIS — L089 Local infection of the skin and subcutaneous tissue, unspecified: Secondary | ICD-10-CM | POA: Diagnosis not present

## 2023-03-12 ENCOUNTER — Encounter: Payer: Self-pay | Admitting: *Deleted

## 2023-03-12 ENCOUNTER — Telehealth: Payer: Self-pay | Admitting: *Deleted

## 2023-03-12 NOTE — Telephone Encounter (Signed)
Spoke with spouse. Patient has been rescheduled for his EGD/ED with Dr. Jena Gauss ASA 3 8/21. Aware to make sure he does not take his OZEMPIC 7 days of procedure. She voiced understanding. Aware will call back with pre-op appt.. instructions also sent.

## 2023-03-13 DIAGNOSIS — M5030 Other cervical disc degeneration, unspecified cervical region: Secondary | ICD-10-CM | POA: Diagnosis not present

## 2023-03-13 DIAGNOSIS — Z79891 Long term (current) use of opiate analgesic: Secondary | ICD-10-CM | POA: Diagnosis not present

## 2023-03-13 DIAGNOSIS — M4802 Spinal stenosis, cervical region: Secondary | ICD-10-CM | POA: Diagnosis not present

## 2023-03-13 DIAGNOSIS — G894 Chronic pain syndrome: Secondary | ICD-10-CM | POA: Diagnosis not present

## 2023-03-13 DIAGNOSIS — G8929 Other chronic pain: Secondary | ICD-10-CM | POA: Diagnosis not present

## 2023-03-13 DIAGNOSIS — E1142 Type 2 diabetes mellitus with diabetic polyneuropathy: Secondary | ICD-10-CM | POA: Diagnosis not present

## 2023-03-13 DIAGNOSIS — M7918 Myalgia, other site: Secondary | ICD-10-CM | POA: Diagnosis not present

## 2023-03-13 DIAGNOSIS — M4722 Other spondylosis with radiculopathy, cervical region: Secondary | ICD-10-CM | POA: Diagnosis not present

## 2023-03-13 DIAGNOSIS — M47812 Spondylosis without myelopathy or radiculopathy, cervical region: Secondary | ICD-10-CM | POA: Diagnosis not present

## 2023-03-13 DIAGNOSIS — G959 Disease of spinal cord, unspecified: Secondary | ICD-10-CM | POA: Diagnosis not present

## 2023-03-24 DIAGNOSIS — M5090 Cervical disc disorder, unspecified, unspecified cervical region: Secondary | ICD-10-CM | POA: Diagnosis not present

## 2023-03-24 DIAGNOSIS — E119 Type 2 diabetes mellitus without complications: Secondary | ICD-10-CM | POA: Diagnosis not present

## 2023-03-24 DIAGNOSIS — E785 Hyperlipidemia, unspecified: Secondary | ICD-10-CM | POA: Diagnosis not present

## 2023-03-24 DIAGNOSIS — R12 Heartburn: Secondary | ICD-10-CM | POA: Diagnosis not present

## 2023-04-09 DIAGNOSIS — I2510 Atherosclerotic heart disease of native coronary artery without angina pectoris: Secondary | ICD-10-CM | POA: Diagnosis not present

## 2023-04-09 DIAGNOSIS — Z5941 Food insecurity: Secondary | ICD-10-CM | POA: Diagnosis not present

## 2023-04-09 DIAGNOSIS — J449 Chronic obstructive pulmonary disease, unspecified: Secondary | ICD-10-CM | POA: Diagnosis not present

## 2023-04-09 DIAGNOSIS — Z7982 Long term (current) use of aspirin: Secondary | ICD-10-CM | POA: Diagnosis not present

## 2023-04-09 DIAGNOSIS — Z7902 Long term (current) use of antithrombotics/antiplatelets: Secondary | ICD-10-CM | POA: Diagnosis not present

## 2023-04-09 DIAGNOSIS — K219 Gastro-esophageal reflux disease without esophagitis: Secondary | ICD-10-CM | POA: Diagnosis not present

## 2023-04-09 DIAGNOSIS — M2142 Flat foot [pes planus] (acquired), left foot: Secondary | ICD-10-CM | POA: Diagnosis not present

## 2023-04-09 DIAGNOSIS — E669 Obesity, unspecified: Secondary | ICD-10-CM | POA: Diagnosis not present

## 2023-04-09 DIAGNOSIS — E11610 Type 2 diabetes mellitus with diabetic neuropathic arthropathy: Secondary | ICD-10-CM | POA: Diagnosis not present

## 2023-04-09 DIAGNOSIS — Z91014 Allergy to mammalian meats: Secondary | ICD-10-CM | POA: Diagnosis not present

## 2023-04-09 DIAGNOSIS — Z882 Allergy status to sulfonamides status: Secondary | ICD-10-CM | POA: Diagnosis not present

## 2023-04-09 DIAGNOSIS — X501XXA Overexertion from prolonged static or awkward postures, initial encounter: Secondary | ICD-10-CM | POA: Diagnosis not present

## 2023-04-09 DIAGNOSIS — F17210 Nicotine dependence, cigarettes, uncomplicated: Secondary | ICD-10-CM | POA: Diagnosis not present

## 2023-04-09 DIAGNOSIS — M79672 Pain in left foot: Secondary | ICD-10-CM | POA: Diagnosis not present

## 2023-04-09 DIAGNOSIS — M85872 Other specified disorders of bone density and structure, left ankle and foot: Secondary | ICD-10-CM | POA: Diagnosis not present

## 2023-04-15 ENCOUNTER — Other Ambulatory Visit: Payer: Self-pay

## 2023-04-15 ENCOUNTER — Encounter (HOSPITAL_COMMUNITY): Payer: Self-pay

## 2023-04-15 ENCOUNTER — Encounter (HOSPITAL_COMMUNITY)
Admission: RE | Admit: 2023-04-15 | Discharge: 2023-04-15 | Disposition: A | Discharge status: Home / Self Care | Payer: Medicaid Other | Source: Ambulatory Visit | Attending: Internal Medicine | Admitting: Internal Medicine

## 2023-04-16 ENCOUNTER — Ambulatory Visit (HOSPITAL_COMMUNITY)
Admission: RE | Admit: 2023-04-16 | Discharge: 2023-04-16 | Disposition: A | Discharge status: Home / Self Care | Payer: Medicaid Other | Attending: Internal Medicine | Admitting: Internal Medicine

## 2023-04-16 ENCOUNTER — Encounter (HOSPITAL_COMMUNITY)
Admission: RE | Disposition: A | Discharge status: Home / Self Care | Payer: Self-pay | Source: Home / Self Care | Attending: Internal Medicine

## 2023-04-16 ENCOUNTER — Ambulatory Visit (HOSPITAL_BASED_OUTPATIENT_CLINIC_OR_DEPARTMENT_OTHER): Payer: Medicaid Other | Admitting: Certified Registered Nurse Anesthetist

## 2023-04-16 ENCOUNTER — Encounter (HOSPITAL_COMMUNITY): Payer: Self-pay | Admitting: Internal Medicine

## 2023-04-16 ENCOUNTER — Telehealth: Payer: Self-pay

## 2023-04-16 ENCOUNTER — Ambulatory Visit (HOSPITAL_COMMUNITY): Payer: Medicaid Other | Admitting: Certified Registered Nurse Anesthetist

## 2023-04-16 DIAGNOSIS — R1013 Epigastric pain: Secondary | ICD-10-CM | POA: Diagnosis not present

## 2023-04-16 DIAGNOSIS — Z7985 Long-term (current) use of injectable non-insulin antidiabetic drugs: Secondary | ICD-10-CM | POA: Insufficient documentation

## 2023-04-16 DIAGNOSIS — K449 Diaphragmatic hernia without obstruction or gangrene: Secondary | ICD-10-CM | POA: Diagnosis not present

## 2023-04-16 DIAGNOSIS — J449 Chronic obstructive pulmonary disease, unspecified: Secondary | ICD-10-CM | POA: Diagnosis not present

## 2023-04-16 DIAGNOSIS — F17210 Nicotine dependence, cigarettes, uncomplicated: Secondary | ICD-10-CM

## 2023-04-16 DIAGNOSIS — G4730 Sleep apnea, unspecified: Secondary | ICD-10-CM | POA: Diagnosis not present

## 2023-04-16 DIAGNOSIS — R1310 Dysphagia, unspecified: Secondary | ICD-10-CM | POA: Diagnosis not present

## 2023-04-16 DIAGNOSIS — Z5986 Financial insecurity: Secondary | ICD-10-CM | POA: Diagnosis not present

## 2023-04-16 DIAGNOSIS — K2100 Gastro-esophageal reflux disease with esophagitis, without bleeding: Secondary | ICD-10-CM | POA: Diagnosis not present

## 2023-04-16 DIAGNOSIS — Z955 Presence of coronary angioplasty implant and graft: Secondary | ICD-10-CM | POA: Diagnosis not present

## 2023-04-16 DIAGNOSIS — I10 Essential (primary) hypertension: Secondary | ICD-10-CM | POA: Insufficient documentation

## 2023-04-16 DIAGNOSIS — K259 Gastric ulcer, unspecified as acute or chronic, without hemorrhage or perforation: Secondary | ICD-10-CM | POA: Insufficient documentation

## 2023-04-16 DIAGNOSIS — K209 Esophagitis, unspecified without bleeding: Secondary | ICD-10-CM | POA: Diagnosis not present

## 2023-04-16 DIAGNOSIS — E119 Type 2 diabetes mellitus without complications: Secondary | ICD-10-CM | POA: Insufficient documentation

## 2023-04-16 DIAGNOSIS — I252 Old myocardial infarction: Secondary | ICD-10-CM | POA: Insufficient documentation

## 2023-04-16 DIAGNOSIS — R1010 Upper abdominal pain, unspecified: Secondary | ICD-10-CM | POA: Diagnosis not present

## 2023-04-16 DIAGNOSIS — I2510 Atherosclerotic heart disease of native coronary artery without angina pectoris: Secondary | ICD-10-CM | POA: Diagnosis not present

## 2023-04-16 DIAGNOSIS — Z8719 Personal history of other diseases of the digestive system: Secondary | ICD-10-CM | POA: Diagnosis not present

## 2023-04-16 HISTORY — PX: BIOPSY: SHX5522

## 2023-04-16 HISTORY — PX: MALONEY DILATION: SHX5535

## 2023-04-16 HISTORY — PX: ESOPHAGOGASTRODUODENOSCOPY (EGD) WITH PROPOFOL: SHX5813

## 2023-04-16 LAB — GLUCOSE, CAPILLARY: Glucose-Capillary: 198 mg/dL — ABNORMAL HIGH (ref 70–99)

## 2023-04-16 SURGERY — ESOPHAGOGASTRODUODENOSCOPY (EGD) WITH PROPOFOL
Anesthesia: General

## 2023-04-16 MED ORDER — PROPOFOL 10 MG/ML IV BOLUS
INTRAVENOUS | Status: DC | PRN
  Administered 2023-04-16 (×2): 80 mg via INTRAVENOUS

## 2023-04-16 MED ORDER — LACTATED RINGERS IV SOLN: INTRAVENOUS | Status: DC | PRN

## 2023-04-16 MED ORDER — PANTOPRAZOLE SODIUM 40 MG PO TBEC: 40.0000 mg | DELAYED_RELEASE_TABLET | Freq: Two times a day (BID) | ORAL | 11 refills | Status: DC

## 2023-04-16 MED ORDER — LIDOCAINE HCL (PF) 2 % IJ SOLN
INTRAMUSCULAR | Status: DC | PRN
  Administered 2023-04-16: 25 mg via INTRADERMAL

## 2023-04-16 MED ORDER — LIDOCAINE HCL (PF) 2 % IJ SOLN
INTRAMUSCULAR | Status: AC
  Filled 2023-04-16: qty 5

## 2023-04-16 MED ORDER — PROPOFOL 500 MG/50ML IV EMUL
INTRAVENOUS | Status: DC | PRN
  Administered 2023-04-16: 180 ug/kg/min via INTRAVENOUS

## 2023-04-16 MED ORDER — PROPOFOL 500 MG/50ML IV EMUL
INTRAVENOUS | Status: AC
  Filled 2023-04-16: qty 50

## 2023-04-16 NOTE — Anesthesia Procedure Notes (Signed)
Procedure Name: General with mask airway Date/Time: 04/16/2023 12:36 PM  Performed by: Cy Blamer, CRNAPre-anesthesia Checklist: Patient identified, Emergency Drugs available, Suction available, Patient being monitored and Timeout performed Patient Re-evaluated:Patient Re-evaluated prior to induction Oxygen Delivery Method: Supernova nasal CPAP Preoxygenation: Pre-oxygenation with 100% oxygen Induction Type: IV induction Placement Confirmation: positive ETCO2 Dental Injury: Teeth and Oropharynx as per pre-operative assessment

## 2023-04-16 NOTE — Anesthesia Preprocedure Evaluation (Signed)
Anesthesia Evaluation  Patient identified by MRN, date of birth, ID band Patient awake    Reviewed: Allergy & Precautions, H&P , NPO status , Patient's Chart, lab work & pertinent test results, reviewed documented beta blocker date and time   Airway Mallampati: II  TM Distance: >3 FB Neck ROM: limited    Dental no notable dental hx.    Pulmonary neg pulmonary ROS, shortness of breath, sleep apnea , pneumonia, COPD, Current Smoker and Patient abstained from smoking.   Pulmonary exam normal breath sounds clear to auscultation       Cardiovascular Exercise Tolerance: Good hypertension, + CAD, + Past MI, + Cardiac Stents and + Peripheral Vascular Disease  negative cardio ROS + dysrhythmias  Rhythm:regular Rate:Normal     Neuro/Psych  Neuromuscular disease CVA negative neurological ROS  negative psych ROS   GI/Hepatic negative GI ROS, Neg liver ROS,GERD  ,,  Endo/Other  negative endocrine ROSdiabetes    Renal/GU negative Renal ROS  negative genitourinary   Musculoskeletal   Abdominal   Peds  Hematology negative hematology ROS (+)   Anesthesia Other Findings   Reproductive/Obstetrics negative OB ROS                             Anesthesia Physical Anesthesia Plan  ASA: 3  Anesthesia Plan: General   Post-op Pain Management:    Induction:   PONV Risk Score and Plan: Propofol infusion  Airway Management Planned:   Additional Equipment:   Intra-op Plan:   Post-operative Plan:   Informed Consent: I have reviewed the patients History and Physical, chart, labs and discussed the procedure including the risks, benefits and alternatives for the proposed anesthesia with the patient or authorized representative who has indicated his/her understanding and acceptance.     Dental Advisory Given  Plan Discussed with: CRNA  Anesthesia Plan Comments:        Anesthesia Quick  Evaluation

## 2023-04-16 NOTE — Telephone Encounter (Signed)
-----   Message from Eula Listen sent at 04/16/2023  1:05 PM EDT -----  needs new prescription for Protonix 40 mg pill.  Dispense 60.  1 - 30 minutes before breakfast and supper.  11 refills.    Office visit in 3 months with app who saw him in June.

## 2023-04-16 NOTE — Telephone Encounter (Signed)
Rx was sent to pharmacy on file.  

## 2023-04-16 NOTE — Transfer of Care (Signed)
Immediate Anesthesia Transfer of Care Note  Patient: Matthew Stewart  Procedure(s) Performed: ESOPHAGOGASTRODUODENOSCOPY (EGD) WITH PROPOFOL MALONEY DILATION BIOPSY  Patient Location: Short Stay  Anesthesia Type:General  Level of Consciousness: drowsy, patient cooperative, and responds to stimulation  Airway & Oxygen Therapy: Patient Spontanous Breathing  Post-op Assessment: Report given to RN, Post -op Vital signs reviewed and stable, Patient moving all extremities X 4, and Patient able to stick tongue midline  Post vital signs: Reviewed and stable  Last Vitals:  Vitals Value Taken Time  BP 102/64   Temp 97.6   Pulse 74   Resp 22   SpO2 93     Last Pain:  Vitals:   04/16/23 1232  TempSrc:   PainSc: 0-No pain      Patients Stated Pain Goal: 8 (04/16/23 1042)  Complications: No notable events documented.

## 2023-04-16 NOTE — Op Note (Signed)
[facility] Patient Name: Matthew Stewart Procedure Date: 04/16/2023 11:44 AM MRN: [id number] Date of Birth: [birth year] Attending MD: Gennette Pac , MD, [id number] CSN: [id number] Age: 62 Admit Type: Outpatient Procedure:                Upper GI endoscopy Indications:              Dysphagia, Heartburn Providers:                Gennette Pac, MD, Crystal Page, Dyann Ruddle, Elinor Parkinson Referring MD:              Medicines:                Propofol per Anesthesia Complications:            No immediate complications. Estimated Blood Loss:     Estimated blood loss was minimal. Procedure:                Pre-Anesthesia Assessment:                           - Prior to the procedure, a History and Physical                            was performed, and patient medications and                            allergies were reviewed. The patient's tolerance of                            previous anesthesia was also reviewed. The risks                            and benefits of the procedure and the sedation                            options and risks were discussed with the patient.                            All questions were answered, and informed consent                            was obtained. Prior Anticoagulants: The patient has                            taken no anticoagulant or antiplatelet agents. ASA                            Grade Assessment: III - A patient with severe                            systemic disease. After reviewing the risks and  benefits, the patient was deemed in satisfactory                            condition to undergo the procedure.                           After obtaining informed consent, the endoscope was                            passed under direct vision. Throughout the                            procedure, the patient's blood pressure, pulse, and                            oxygen  saturations were monitored continuously. The                            GIF-H190 ([id number]) scope was introduced through the                            mouth, and advanced to the second part of duodenum.                            The upper GI endoscopy was accomplished without                            difficulty. The patient tolerated the procedure                            well. Scope In: 12:46:18 PM Scope Out: 12:52:52 PM Total Procedure Duration: 0 hours 6 minutes 34 seconds  Findings:      LA Grade B (one or more mucosal breaks greater than 5 mm, not extending       between the tops of two mucosal folds) esophagitis with no bleeding was       found. Tubular esophagus patent throughout its course. No Barrett's       epithelium.      A medium-sized hiatal hernia was present. Gastric cavity empty.       Scattered antral and body erosions. No ulcer or infiltrating process.       Patent pylorus.      The duodenal bulb and second portion of the duodenum were normal. The       scope was withdrawn. Dilation was performed with a Maloney dilator with       mild resistance at 56 Fr. The dilation site was examined following       endoscope reinsertion and showed no change. Estimated blood loss: none.       Finally, biopsies of the gastric antrum and body were taken for       histologic study. Impression:               - LA Grade B esophagitis with no bleeding. Dilated.                           - Medium-sized hiatal hernia.  Gastric erosions.                            Status post biopsy                           - Normal duodenal bulb and second portion of the                            duodenum.                           -Patient has poorly controlled reflux. He needs                            aggressive management. Moderate Sedation:      Moderate (conscious) sedation was personally administered by an       anesthesia professional. The following parameters were monitored: oxygen        saturation, heart rate, blood pressure, respiratory rate, EKG, adequacy       of pulmonary ventilation, and response to care. Recommendation:           - Patient has a contact number available for                            emergencies. The signs and symptoms of potential                            delayed complications were discussed with the                            patient. Return to normal activities tomorrow.                            Written discharge instructions were provided to the                            patient.                           - Advance diet as tolerated. May resume Ozempic                            tomorrow if clinically indicated. Stop Pepcid.                            Begin Protonix 40 mg twice daily 30 minutes before                            breakfast and supper.                           Follow-up on pathology. Office visit with [place] in 3                            months Procedure Code(s):        ---  Professional ---                           [phone], Esophagogastroduodenoscopy, flexible,                            transoral; diagnostic, including collection of                            specimen(s) by brushing or washing, when performed                            (separate procedure)                           43450, Dilation of esophagus, by unguided sound or                            bougie, single or multiple passes Diagnosis Code(s):        --- Professional ---                           K20.90, Esophagitis, unspecified without bleeding                           K44.9, Diaphragmatic hernia without obstruction or                            gangrene                           R13.10, Dysphagia, unspecified                           R12, Heartburn CPT copyright 2022 American Medical Association. All rights reserved. The codes documented in this report are preliminary and upon coder review may  be revised to meet current compliance requirements. Gerrit Friends. Shya Kovatch, MD Gennette Pac, MD 04/16/2023 1:16:58 PM This report has been signed electronically. Number of Addenda: 0

## 2023-04-16 NOTE — Discharge Instructions (Signed)
EGD Discharge instructions Please read the instructions outlined below and refer to this sheet in the next few weeks. These discharge instructions provide you with general information on caring for yourself after you leave the hospital. Your doctor may also give you specific instructions. While your treatment has been planned according to the most current medical practices available, unavoidable complications occasionally occur. If you have any problems or questions after discharge, please call your doctor. ACTIVITY You may resume your regular activity but move at a slower pace for the next 24 hours.  Take frequent rest periods for the next 24 hours.  Walking will help expel (get rid of) the air and reduce the bloated feeling in your abdomen.  No driving for 24 hours (because of the anesthesia (medicine) used during the test).  You may shower.  Do not sign any important legal documents or operate any machinery for 24 hours (because of the anesthesia used during the test).  NUTRITION Drink plenty of fluids.  You may resume your normal diet.  Begin with a light meal and progress to your normal diet.  Avoid alcoholic beverages for 24 hours or as instructed by your caregiver.  MEDICATIONS You may resume your normal medications unless your caregiver tells you otherwise.  WHAT YOU CAN EXPECT TODAY You may experience abdominal discomfort such as a feeling of fullness or "gas" pains.  FOLLOW-UP Your doctor will discuss the results of your test with you.  SEEK IMMEDIATE MEDICAL ATTENTION IF ANY OF THE FOLLOWING OCCUR: Excessive nausea (feeling sick to your stomach) and/or vomiting.  Severe abdominal pain and distention (swelling).  Trouble swallowing.  Temperature over 101 F (37.8 C).  Rectal bleeding or vomiting of blood.      You have acid burns in your esophagus from  acid reflux.    No blockage.  Your esophagus was stretched.   your stomach was also inflamed.  Stomach was biopsied.     Stop Pepcid  Begin Protonix 40 mg tablet 1 twice daily 30 minutes before breakfast and supper.  New prescription being sent to your pharmacy from the office.    GERD information provided.     Further recommendations to follow pending review of pathology report.    At patient request, I called Damyan Cost [phone] -
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
## 2023-04-16 NOTE — Telephone Encounter (Signed)
Mandy: Please arrange f/u in 3 mths with Verlon Au.

## 2023-04-17 ENCOUNTER — Encounter: Payer: Self-pay | Admitting: Internal Medicine

## 2023-04-17 LAB — SURGICAL PATHOLOGY

## 2023-04-18 ENCOUNTER — Encounter (HOSPITAL_COMMUNITY): Payer: Self-pay | Admitting: Internal Medicine

## 2023-04-18 NOTE — Anesthesia Postprocedure Evaluation (Signed)
Anesthesia Post Note  Patient: Matthew Stewart  Procedure(s) Performed: ESOPHAGOGASTRODUODENOSCOPY (EGD) WITH PROPOFOL MALONEY DILATION BIOPSY  Patient location during evaluation: Phase II Anesthesia Type: General Level of consciousness: awake Pain management: pain level controlled Vital Signs Assessment: post-procedure vital signs reviewed and stable Respiratory status: spontaneous breathing and respiratory function stable Cardiovascular status: blood pressure returned to baseline and stable Postop Assessment: no headache and no apparent nausea or vomiting Anesthetic complications: no Comments: Late entry   No notable events documented.   Last Vitals:  Vitals:   04/16/23 1052 04/16/23 1300  BP: (!) 134/96 102/64  Pulse: 73 75  Resp: 18 20  Temp: 36.7 C 36.4 C  SpO2: 97% 91%    Last Pain:  Vitals:   04/16/23 1310  TempSrc:   PainSc: 0-No pain                 Windell Norfolk

## 2023-05-06 DIAGNOSIS — G4700 Insomnia, unspecified: Secondary | ICD-10-CM | POA: Diagnosis not present

## 2023-06-03 DIAGNOSIS — E119 Type 2 diabetes mellitus without complications: Secondary | ICD-10-CM | POA: Diagnosis not present

## 2023-06-03 DIAGNOSIS — R12 Heartburn: Secondary | ICD-10-CM | POA: Diagnosis not present

## 2023-06-03 DIAGNOSIS — F329 Major depressive disorder, single episode, unspecified: Secondary | ICD-10-CM | POA: Diagnosis not present

## 2023-06-03 DIAGNOSIS — E785 Hyperlipidemia, unspecified: Secondary | ICD-10-CM | POA: Diagnosis not present

## 2023-06-03 DIAGNOSIS — M5090 Cervical disc disorder, unspecified, unspecified cervical region: Secondary | ICD-10-CM | POA: Diagnosis not present

## 2023-06-03 DIAGNOSIS — G4700 Insomnia, unspecified: Secondary | ICD-10-CM | POA: Diagnosis not present

## 2023-06-10 ENCOUNTER — Encounter: Payer: Self-pay | Admitting: Gastroenterology

## 2023-06-24 DIAGNOSIS — G4700 Insomnia, unspecified: Secondary | ICD-10-CM | POA: Diagnosis not present

## 2023-06-24 DIAGNOSIS — E785 Hyperlipidemia, unspecified: Secondary | ICD-10-CM | POA: Diagnosis not present

## 2023-06-24 DIAGNOSIS — R12 Heartburn: Secondary | ICD-10-CM | POA: Diagnosis not present

## 2023-06-24 DIAGNOSIS — E119 Type 2 diabetes mellitus without complications: Secondary | ICD-10-CM | POA: Diagnosis not present

## 2023-06-24 DIAGNOSIS — M5090 Cervical disc disorder, unspecified, unspecified cervical region: Secondary | ICD-10-CM | POA: Diagnosis not present

## 2023-06-24 DIAGNOSIS — F329 Major depressive disorder, single episode, unspecified: Secondary | ICD-10-CM | POA: Diagnosis not present

## 2023-09-01 DIAGNOSIS — H5213 Myopia, bilateral: Secondary | ICD-10-CM | POA: Diagnosis not present

## 2023-09-12 DIAGNOSIS — E119 Type 2 diabetes mellitus without complications: Secondary | ICD-10-CM | POA: Diagnosis not present

## 2023-09-12 DIAGNOSIS — E785 Hyperlipidemia, unspecified: Secondary | ICD-10-CM | POA: Diagnosis not present

## 2023-09-12 DIAGNOSIS — G4700 Insomnia, unspecified: Secondary | ICD-10-CM | POA: Diagnosis not present

## 2023-09-12 DIAGNOSIS — M5090 Cervical disc disorder, unspecified, unspecified cervical region: Secondary | ICD-10-CM | POA: Diagnosis not present

## 2023-09-12 DIAGNOSIS — F329 Major depressive disorder, single episode, unspecified: Secondary | ICD-10-CM | POA: Diagnosis not present

## 2023-09-12 DIAGNOSIS — R12 Heartburn: Secondary | ICD-10-CM | POA: Diagnosis not present

## 2023-10-30 DIAGNOSIS — M21969 Unspecified acquired deformity of unspecified lower leg: Secondary | ICD-10-CM | POA: Diagnosis not present

## 2023-11-05 DIAGNOSIS — E785 Hyperlipidemia, unspecified: Secondary | ICD-10-CM | POA: Diagnosis not present

## 2023-11-05 DIAGNOSIS — M5090 Cervical disc disorder, unspecified, unspecified cervical region: Secondary | ICD-10-CM | POA: Diagnosis not present

## 2023-11-05 DIAGNOSIS — F329 Major depressive disorder, single episode, unspecified: Secondary | ICD-10-CM | POA: Diagnosis not present

## 2023-11-05 DIAGNOSIS — R12 Heartburn: Secondary | ICD-10-CM | POA: Diagnosis not present

## 2023-11-05 DIAGNOSIS — E119 Type 2 diabetes mellitus without complications: Secondary | ICD-10-CM | POA: Diagnosis not present

## 2023-11-05 DIAGNOSIS — G4700 Insomnia, unspecified: Secondary | ICD-10-CM | POA: Diagnosis not present

## 2024-01-08 DIAGNOSIS — M5090 Cervical disc disorder, unspecified, unspecified cervical region: Secondary | ICD-10-CM | POA: Diagnosis not present

## 2024-01-08 DIAGNOSIS — G4700 Insomnia, unspecified: Secondary | ICD-10-CM | POA: Diagnosis not present

## 2024-01-08 DIAGNOSIS — R12 Heartburn: Secondary | ICD-10-CM | POA: Diagnosis not present

## 2024-01-08 DIAGNOSIS — F329 Major depressive disorder, single episode, unspecified: Secondary | ICD-10-CM | POA: Diagnosis not present

## 2024-01-08 DIAGNOSIS — E119 Type 2 diabetes mellitus without complications: Secondary | ICD-10-CM | POA: Diagnosis not present

## 2024-01-08 DIAGNOSIS — E785 Hyperlipidemia, unspecified: Secondary | ICD-10-CM | POA: Diagnosis not present

## 2024-02-20 ENCOUNTER — Other Ambulatory Visit: Payer: Self-pay

## 2024-02-20 ENCOUNTER — Ambulatory Visit: Admitting: Internal Medicine

## 2024-02-20 VITALS — BP 116/75 | HR 70 | Temp 98.6°F | Ht 76.0 in | Wt 293.4 lb

## 2024-02-20 DIAGNOSIS — R140 Abdominal distension (gaseous): Secondary | ICD-10-CM | POA: Diagnosis not present

## 2024-02-20 DIAGNOSIS — Z860101 Personal history of adenomatous and serrated colon polyps: Secondary | ICD-10-CM | POA: Diagnosis not present

## 2024-02-20 DIAGNOSIS — K9289 Other specified diseases of the digestive system: Secondary | ICD-10-CM

## 2024-02-20 DIAGNOSIS — R143 Flatulence: Secondary | ICD-10-CM

## 2024-02-20 DIAGNOSIS — R142 Eructation: Secondary | ICD-10-CM

## 2024-02-20 DIAGNOSIS — R1319 Other dysphagia: Secondary | ICD-10-CM

## 2024-02-20 DIAGNOSIS — K219 Gastro-esophageal reflux disease without esophagitis: Secondary | ICD-10-CM | POA: Diagnosis not present

## 2024-02-20 DIAGNOSIS — Z860100 Personal history of colon polyps, unspecified: Secondary | ICD-10-CM

## 2024-02-20 MED ORDER — PANTOPRAZOLE SODIUM 40 MG PO TBEC: 40.0000 mg | DELAYED_RELEASE_TABLET | Freq: Every day | ORAL | 3 refills | Status: AC

## 2024-02-20 NOTE — Patient Instructions (Signed)
 Good to see you again today!  Your bloating and flatulence are almost certainly related to Mounjaro.  Aggravating symptoms which can be treated but not totally alleviated unless he stopped the medication (which I do not recommend).  You have complicated reflux (acid burns in your esophagus that can be seen).  You need to be on Protonix  or pantoprazole  40 mg daily (dispense 90 with 3 refills).  Best taken 30 minutes before breakfast.  You should take this medication indefinitely.  It will help to some degree with the bloating.  Start a probiotic like digestive advantage for gas bloat.  Take every day.  Also may use over-the-counter simethicone(gas aids/Gas-X, etc.) as needed for gas  Information on antigas/bloat diet  You will need a repeat colonoscopy in 2028.  Office visit here in 3 months

## 2024-02-20 NOTE — Progress Notes (Unsigned)
 SABRA
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
## 2024-03-16 DIAGNOSIS — M5412 Radiculopathy, cervical region: Secondary | ICD-10-CM | POA: Diagnosis not present

## 2024-04-22 DIAGNOSIS — M21171 Varus deformity, not elsewhere classified, right ankle: Secondary | ICD-10-CM | POA: Diagnosis not present

## 2024-04-22 DIAGNOSIS — M14671 Charcot's joint, right ankle and foot: Secondary | ICD-10-CM | POA: Diagnosis not present

## 2024-04-22 DIAGNOSIS — M21172 Varus deformity, not elsewhere classified, left ankle: Secondary | ICD-10-CM | POA: Diagnosis not present

## 2024-04-22 DIAGNOSIS — M14672 Charcot's joint, left ankle and foot: Secondary | ICD-10-CM | POA: Diagnosis not present

## 2024-04-28 DIAGNOSIS — R12 Heartburn: Secondary | ICD-10-CM | POA: Diagnosis not present

## 2024-04-28 DIAGNOSIS — E785 Hyperlipidemia, unspecified: Secondary | ICD-10-CM | POA: Diagnosis not present

## 2024-04-28 DIAGNOSIS — G4700 Insomnia, unspecified: Secondary | ICD-10-CM | POA: Diagnosis not present

## 2024-04-28 DIAGNOSIS — F329 Major depressive disorder, single episode, unspecified: Secondary | ICD-10-CM | POA: Diagnosis not present

## 2024-04-28 DIAGNOSIS — M5090 Cervical disc disorder, unspecified, unspecified cervical region: Secondary | ICD-10-CM | POA: Diagnosis not present

## 2024-04-28 DIAGNOSIS — E119 Type 2 diabetes mellitus without complications: Secondary | ICD-10-CM | POA: Diagnosis not present

## 2024-05-28 ENCOUNTER — Encounter: Payer: Self-pay | Admitting: Internal Medicine

## 2024-05-28 ENCOUNTER — Ambulatory Visit: Admitting: Internal Medicine

## 2024-05-28 VITALS — BP 124/82 | HR 72 | Temp 97.8°F | Ht 76.0 in | Wt 294.6 lb

## 2024-05-28 DIAGNOSIS — R197 Diarrhea, unspecified: Secondary | ICD-10-CM | POA: Diagnosis not present

## 2024-05-28 DIAGNOSIS — R1319 Other dysphagia: Secondary | ICD-10-CM

## 2024-05-28 DIAGNOSIS — K219 Gastro-esophageal reflux disease without esophagitis: Secondary | ICD-10-CM

## 2024-05-28 NOTE — Patient Instructions (Signed)
 It was good to see you again today  Continue Protonix  40 mg every morning before breakfast.  May use famotidine  as needed for breakthrough symptoms  Your bowel issues are likely due to a combination of diabetes and Mounjaro as discussed.  Long-term benefits of good blood sugar control and Mounjaro outweigh the side effects at this time  Use Imodium as needed for occasional diarrhea  You will be due for a repeat colonoscopy 2028  Will see you back in the office in 6 months  If anything comes up between now and then please do not hesitate to give me a call
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
## 2024-06-21 DIAGNOSIS — E756 Lipid storage disorder, unspecified: Secondary | ICD-10-CM | POA: Diagnosis not present

## 2024-06-21 DIAGNOSIS — F329 Major depressive disorder, single episode, unspecified: Secondary | ICD-10-CM | POA: Diagnosis not present

## 2024-06-21 DIAGNOSIS — R12 Heartburn: Secondary | ICD-10-CM | POA: Diagnosis not present

## 2024-06-21 DIAGNOSIS — M5090 Cervical disc disorder, unspecified, unspecified cervical region: Secondary | ICD-10-CM | POA: Diagnosis not present

## 2024-06-21 DIAGNOSIS — E785 Hyperlipidemia, unspecified: Secondary | ICD-10-CM | POA: Diagnosis not present

## 2024-06-21 DIAGNOSIS — G4700 Insomnia, unspecified: Secondary | ICD-10-CM | POA: Diagnosis not present

## 2024-06-21 DIAGNOSIS — E119 Type 2 diabetes mellitus without complications: Secondary | ICD-10-CM | POA: Diagnosis not present

## 2024-07-29 DIAGNOSIS — F329 Major depressive disorder, single episode, unspecified: Secondary | ICD-10-CM | POA: Diagnosis not present

## 2024-07-29 DIAGNOSIS — E756 Lipid storage disorder, unspecified: Secondary | ICD-10-CM | POA: Diagnosis not present

## 2024-07-29 DIAGNOSIS — R12 Heartburn: Secondary | ICD-10-CM | POA: Diagnosis not present

## 2024-07-29 DIAGNOSIS — E785 Hyperlipidemia, unspecified: Secondary | ICD-10-CM | POA: Diagnosis not present

## 2024-07-29 DIAGNOSIS — M5090 Cervical disc disorder, unspecified, unspecified cervical region: Secondary | ICD-10-CM | POA: Diagnosis not present

## 2024-07-29 DIAGNOSIS — G4700 Insomnia, unspecified: Secondary | ICD-10-CM | POA: Diagnosis not present

## 2024-07-29 DIAGNOSIS — E119 Type 2 diabetes mellitus without complications: Secondary | ICD-10-CM | POA: Diagnosis not present
